# Patient Record
Sex: Female | Born: 1935 | Race: White | Hispanic: No
Health system: Southern US, Community
[De-identification: ages and names within clinical notes are randomized; demographics above are authoritative.]

---

## 2005-02-25 ENCOUNTER — Inpatient Hospital Stay: Payer: Self-pay | Admitting: Internal Medicine

## 2005-04-23 ENCOUNTER — Ambulatory Visit: Payer: Self-pay | Admitting: Internal Medicine

## 2005-06-04 ENCOUNTER — Ambulatory Visit: Payer: Self-pay | Admitting: Internal Medicine

## 2007-04-29 ENCOUNTER — Ambulatory Visit: Payer: Self-pay | Admitting: Specialist

## 2007-06-12 ENCOUNTER — Other Ambulatory Visit: Payer: Self-pay

## 2007-06-12 ENCOUNTER — Inpatient Hospital Stay: Payer: Self-pay | Admitting: Internal Medicine

## 2008-05-31 ENCOUNTER — Ambulatory Visit: Payer: Self-pay | Admitting: Internal Medicine

## 2008-06-09 ENCOUNTER — Ambulatory Visit: Payer: Self-pay | Admitting: Internal Medicine

## 2008-06-13 ENCOUNTER — Other Ambulatory Visit: Payer: Self-pay

## 2008-06-13 ENCOUNTER — Emergency Department: Payer: Self-pay | Admitting: Internal Medicine

## 2008-06-15 ENCOUNTER — Ambulatory Visit: Payer: Self-pay | Admitting: Internal Medicine

## 2008-08-23 ENCOUNTER — Ambulatory Visit: Payer: Self-pay | Admitting: Internal Medicine

## 2009-09-13 ENCOUNTER — Ambulatory Visit: Payer: Self-pay | Admitting: Internal Medicine

## 2009-09-25 ENCOUNTER — Ambulatory Visit: Payer: Self-pay | Admitting: Internal Medicine

## 2009-10-11 ENCOUNTER — Ambulatory Visit: Payer: Self-pay | Admitting: Surgery

## 2010-10-24 ENCOUNTER — Ambulatory Visit: Payer: Self-pay | Admitting: Internal Medicine

## 2011-12-05 ENCOUNTER — Inpatient Hospital Stay: Payer: Self-pay | Admitting: Internal Medicine

## 2013-03-17 ENCOUNTER — Ambulatory Visit: Payer: Self-pay | Admitting: Ophthalmology

## 2013-03-23 ENCOUNTER — Ambulatory Visit: Payer: Self-pay | Admitting: Ophthalmology

## 2013-04-28 ENCOUNTER — Ambulatory Visit: Payer: Self-pay | Admitting: Specialist

## 2013-05-16 ENCOUNTER — Ambulatory Visit: Payer: Self-pay | Admitting: Internal Medicine

## 2013-05-26 ENCOUNTER — Ambulatory Visit: Payer: Self-pay | Admitting: Specialist

## 2013-05-26 ENCOUNTER — Inpatient Hospital Stay: Payer: Self-pay | Admitting: Family Medicine

## 2013-05-26 LAB — CBC
HCT: 38.4 % (ref 35.0–47.0)
MCH: 30.4 pg (ref 26.0–34.0)
MCHC: 33.5 g/dL (ref 32.0–36.0)
Platelet: 314 10*3/uL (ref 150–440)
RBC: 4.22 10*6/uL (ref 3.80–5.20)
WBC: 9.8 10*3/uL (ref 3.6–11.0)

## 2013-05-26 LAB — COMPREHENSIVE METABOLIC PANEL
Albumin: 3.4 g/dL (ref 3.4–5.0)
BUN: 11 mg/dL (ref 7–18)
Chloride: 89 mmol/L — ABNORMAL LOW (ref 98–107)
Co2: 42 mmol/L (ref 21–32)
Creatinine: 0.62 mg/dL (ref 0.60–1.30)
EGFR (Non-African Amer.): >60
Osmolality: 263 (ref 275–301)
SGOT(AST): 31 U/L (ref 15–37)
SGPT (ALT): 30 U/L (ref 12–78)
Sodium: 132 mmol/L — ABNORMAL LOW (ref 136–145)
Total Protein: 6.2 g/dL — ABNORMAL LOW (ref 6.4–8.2)

## 2013-05-26 LAB — CK TOTAL AND CKMB (NOT AT ARMC): CK, Total: 43 U/L (ref 21–215)

## 2013-05-26 LAB — PRO B NATRIURETIC PEPTIDE: B-Type Natriuretic Peptide: 4979 pg/mL — ABNORMAL HIGH (ref 0–450)

## 2013-05-26 LAB — APTT: Activated PTT: 31.9 secs (ref 23.6–35.9)

## 2013-05-26 LAB — PROTIME-INR: INR: 1.2

## 2013-05-26 LAB — PLATELET COUNT: Platelet: 323 10*3/uL (ref 150–440)

## 2013-05-26 LAB — TROPONIN I: Troponin-I: <0.02 ng/mL

## 2013-05-27 LAB — URINALYSIS, COMPLETE
Bacteria: NONE SEEN
Bilirubin,UR: NEGATIVE
Blood: NEGATIVE
Glucose,UR: NEGATIVE mg/dL (ref 0–75)
Ph: 6 (ref 4.5–8.0)
Protein: NEGATIVE
RBC,UR: NONE SEEN /HPF (ref 0–5)
Squamous Epithelial: 1

## 2013-05-27 LAB — CBC WITH DIFFERENTIAL/PLATELET
Basophil #: 0 10*3/uL (ref 0.0–0.1)
Eosinophil %: 1.2 %
HCT: 35.3 % (ref 35.0–47.0)
Lymphocyte %: 15.5 %
MCH: 30.6 pg (ref 26.0–34.0)
MCHC: 34.1 g/dL (ref 32.0–36.0)
MCV: 90 fL (ref 80–100)
Neutrophil #: 6.2 10*3/uL (ref 1.4–6.5)
RBC: 3.92 10*6/uL (ref 3.80–5.20)
RDW: 15 % — ABNORMAL HIGH (ref 11.5–14.5)
WBC: 8.8 10*3/uL (ref 3.6–11.0)

## 2013-05-27 LAB — BODY FLUID CELL COUNT WITH DIFFERENTIAL
Eosinophil: 0 %
Lymphocytes: 57 %
Nucleated Cell Count: 381 /mm3
Other Cells BF: 0 %

## 2013-05-27 LAB — PROTEIN, BODY FLUID: Protein, Body Fluid: 1.3 g/dL

## 2013-05-27 LAB — BASIC METABOLIC PANEL
Calcium, Total: 8.7 mg/dL (ref 8.5–10.1)
Co2: 42 mmol/L (ref 21–32)
EGFR (Non-African Amer.): >60
Glucose: 93 mg/dL (ref 65–99)
Osmolality: 268 (ref 275–301)

## 2013-05-27 LAB — GLUCOSE, SEROUS FLUID: Glucose, Body Fluid: 106 mg/dL

## 2013-05-28 LAB — BASIC METABOLIC PANEL
BUN: 13 mg/dL (ref 7–18)
Chloride: 89 mmol/L — ABNORMAL LOW (ref 98–107)
Co2: >45 mmol/L (ref 21–32)
Creatinine: 0.77 mg/dL (ref 0.60–1.30)
EGFR (African American): >60
EGFR (Non-African Amer.): >60
Potassium: 3.5 mmol/L (ref 3.5–5.1)

## 2013-05-29 LAB — CBC WITH DIFFERENTIAL/PLATELET
HCT: 39.1 % (ref 35.0–47.0)
HGB: 12.8 g/dL (ref 12.0–16.0)
MCHC: 32.6 g/dL (ref 32.0–36.0)
Neutrophil #: 7 10*3/uL — ABNORMAL HIGH (ref 1.4–6.5)
RDW: 15.3 % — ABNORMAL HIGH (ref 11.5–14.5)

## 2013-05-29 LAB — BASIC METABOLIC PANEL
Anion Gap: 4 — ABNORMAL LOW (ref 7–16)
BUN: 14 mg/dL (ref 7–18)
Creatinine: 0.67 mg/dL (ref 0.60–1.30)
EGFR (African American): >60
Glucose: 148 mg/dL — ABNORMAL HIGH (ref 65–99)
Osmolality: 273 (ref 275–301)

## 2013-05-31 LAB — CBC WITH DIFFERENTIAL/PLATELET
Basophil %: 0.3 %
Eosinophil #: 0.1 10*3/uL (ref 0.0–0.7)
Eosinophil %: 1.2 %
HGB: 12.2 g/dL (ref 12.0–16.0)
Lymphocyte %: 14.3 %
MCH: 29.9 pg (ref 26.0–34.0)
MCHC: 32.9 g/dL (ref 32.0–36.0)
Monocyte #: 0.9 x10 3/mm (ref 0.2–0.9)
Monocyte %: 9.3 %
Platelet: 266 10*3/uL (ref 150–440)

## 2013-05-31 LAB — BASIC METABOLIC PANEL
BUN: 15 mg/dL (ref 7–18)
Chloride: 85 mmol/L — ABNORMAL LOW (ref 98–107)
Creatinine: 0.67 mg/dL (ref 0.60–1.30)
EGFR (African American): >60
EGFR (Non-African Amer.): >60
Osmolality: 270 (ref 275–301)
Potassium: 3.9 mmol/L (ref 3.5–5.1)
Sodium: 135 mmol/L — ABNORMAL LOW (ref 136–145)

## 2013-05-31 LAB — BODY FLUID CULTURE

## 2013-06-01 LAB — BASIC METABOLIC PANEL
Anion Gap: 4 — ABNORMAL LOW (ref 7–16)
Calcium, Total: 8.5 mg/dL (ref 8.5–10.1)
Co2: 44 mmol/L (ref 21–32)
Creatinine: 0.57 mg/dL — ABNORMAL LOW (ref 0.60–1.30)
EGFR (African American): >60
EGFR (Non-African Amer.): >60
Glucose: 86 mg/dL (ref 65–99)
Potassium: 3.8 mmol/L (ref 3.5–5.1)

## 2013-06-01 LAB — CBC WITH DIFFERENTIAL/PLATELET
Basophil #: 0 10*3/uL (ref 0.0–0.1)
HGB: 13 g/dL (ref 12.0–16.0)
Lymphocyte #: 1.4 10*3/uL (ref 1.0–3.6)
Lymphocyte %: 13.4 %
MCHC: 32.8 g/dL (ref 32.0–36.0)
MCV: 91 fL (ref 80–100)
Monocyte %: 9.8 %
Neutrophil #: 8 10*3/uL — ABNORMAL HIGH (ref 1.4–6.5)
Platelet: 283 10*3/uL (ref 150–440)
RBC: 4.36 10*6/uL (ref 3.80–5.20)
WBC: 10.6 10*3/uL (ref 3.6–11.0)

## 2013-06-02 LAB — BASIC METABOLIC PANEL
Anion Gap: 3 — ABNORMAL LOW (ref 7–16)
BUN: 10 mg/dL (ref 7–18)
Chloride: 90 mmol/L — ABNORMAL LOW (ref 98–107)
Co2: 44 mmol/L (ref 21–32)
Creatinine: 0.55 mg/dL — ABNORMAL LOW (ref 0.60–1.30)
EGFR (African American): >60
EGFR (Non-African Amer.): >60
Glucose: 90 mg/dL (ref 65–99)
Osmolality: 272 (ref 275–301)
Potassium: 3.4 mmol/L — ABNORMAL LOW (ref 3.5–5.1)
Sodium: 137 mmol/L (ref 136–145)

## 2013-06-03 LAB — BASIC METABOLIC PANEL
BUN: 12 mg/dL (ref 7–18)
Calcium, Total: 8.9 mg/dL (ref 8.5–10.1)
Co2: 44 mmol/L (ref 21–32)
Creatinine: 0.54 mg/dL — ABNORMAL LOW (ref 0.60–1.30)
EGFR (Non-African Amer.): >60
Glucose: 91 mg/dL (ref 65–99)
Potassium: 3.8 mmol/L (ref 3.5–5.1)
Sodium: 136 mmol/L (ref 136–145)

## 2013-06-04 LAB — BASIC METABOLIC PANEL
Anion Gap: 0 — ABNORMAL LOW (ref 7–16)
BUN: 13 mg/dL (ref 7–18)
Co2: 44 mmol/L (ref 21–32)
Creatinine: 0.67 mg/dL (ref 0.60–1.30)
Glucose: 98 mg/dL (ref 65–99)
Osmolality: 276 (ref 275–301)
Potassium: 4.5 mmol/L (ref 3.5–5.1)
Sodium: 138 mmol/L (ref 136–145)

## 2013-06-04 LAB — CBC WITH DIFFERENTIAL/PLATELET
Lymphocyte %: 11.7 %
Neutrophil #: 8.6 10*3/uL — ABNORMAL HIGH (ref 1.4–6.5)
Neutrophil %: 77.5 %
Platelet: 283 10*3/uL (ref 150–440)
RBC: 4.32 10*6/uL (ref 3.80–5.20)
WBC: 11.1 10*3/uL — ABNORMAL HIGH (ref 3.6–11.0)

## 2013-06-06 LAB — CULTURE, FUNGUS WITHOUT SMEAR

## 2013-06-17 LAB — CULTURE, FUNGUS WITHOUT SMEAR

## 2013-09-11 ENCOUNTER — Inpatient Hospital Stay: Payer: Self-pay | Admitting: Family Medicine

## 2013-09-11 LAB — BASIC METABOLIC PANEL
BUN: 22 mg/dL — ABNORMAL HIGH (ref 7–18)
Calcium, Total: 9.9 mg/dL (ref 8.5–10.1)
Chloride: 97 mmol/L — ABNORMAL LOW (ref 98–107)
Co2: 43 mmol/L (ref 21–32)
Creatinine: 0.68 mg/dL (ref 0.60–1.30)
EGFR (Non-African Amer.): >60
Glucose: 143 mg/dL — ABNORMAL HIGH (ref 65–99)
Sodium: 139 mmol/L (ref 136–145)

## 2013-09-11 LAB — TROPONIN I
Troponin-I: 0.02 ng/mL
Troponin-I: 0.02 ng/mL

## 2013-09-11 LAB — CK TOTAL AND CKMB (NOT AT ARMC)
CK, Total: 20 U/L — ABNORMAL LOW (ref 21–215)
CK-MB: 1.6 ng/mL (ref 0.5–3.6)

## 2013-09-11 LAB — CBC
MCHC: 32.9 g/dL (ref 32.0–36.0)
MCV: 92 fL (ref 80–100)
RBC: 3.65 10*6/uL — ABNORMAL LOW (ref 3.80–5.20)
RDW: 15.9 % — ABNORMAL HIGH (ref 11.5–14.5)

## 2013-09-11 LAB — DIGOXIN LEVEL: Digoxin: 1.23 ng/mL

## 2013-09-12 LAB — CBC WITH DIFFERENTIAL/PLATELET
Basophil %: 0.1 %
Eosinophil %: 0 %
HCT: 32 % — ABNORMAL LOW (ref 35.0–47.0)
Lymphocyte %: 9.4 %
MCH: 30 pg (ref 26.0–34.0)
Monocyte #: 0.1 x10 3/mm — ABNORMAL LOW (ref 0.2–0.9)
Neutrophil #: 4 10*3/uL (ref 1.4–6.5)
Platelet: 193 10*3/uL (ref 150–440)
RDW: 16 % — ABNORMAL HIGH (ref 11.5–14.5)

## 2013-09-12 LAB — LIPID PANEL
HDL Cholesterol: 32 mg/dL — ABNORMAL LOW (ref 40–60)
Ldl Cholesterol, Calc: 137 mg/dL — ABNORMAL HIGH (ref 0–100)
Triglycerides: 54 mg/dL (ref 0–200)

## 2013-09-12 LAB — BASIC METABOLIC PANEL
Anion Gap: 1 — ABNORMAL LOW (ref 7–16)
BUN: 22 mg/dL — ABNORMAL HIGH (ref 7–18)
Calcium, Total: 9.5 mg/dL (ref 8.5–10.1)
Chloride: 96 mmol/L — ABNORMAL LOW (ref 98–107)
Creatinine: 0.65 mg/dL (ref 0.60–1.30)
EGFR (African American): >60
EGFR (Non-African Amer.): >60
Osmolality: 281 (ref 275–301)
Sodium: 138 mmol/L (ref 136–145)

## 2013-09-12 LAB — TROPONIN I: Troponin-I: <0.02 ng/mL

## 2013-09-14 LAB — CBC WITH DIFFERENTIAL/PLATELET
Basophil %: 0.1 %
Eosinophil #: 0 10*3/uL (ref 0.0–0.7)
Eosinophil %: 0 %
HCT: 34.5 % — ABNORMAL LOW (ref 35.0–47.0)
HGB: 11.2 g/dL — ABNORMAL LOW (ref 12.0–16.0)
MCH: 29.6 pg (ref 26.0–34.0)
MCHC: 32.5 g/dL (ref 32.0–36.0)
Monocyte #: 1 x10 3/mm — ABNORMAL HIGH (ref 0.2–0.9)
Monocyte %: 9.7 %
Neutrophil #: 8.3 10*3/uL — ABNORMAL HIGH (ref 1.4–6.5)
Neutrophil %: 83.3 %
Platelet: 247 10*3/uL (ref 150–440)
RDW: 15.6 % — ABNORMAL HIGH (ref 11.5–14.5)
WBC: 10 10*3/uL (ref 3.6–11.0)

## 2013-09-14 LAB — BASIC METABOLIC PANEL
Creatinine: 0.67 mg/dL (ref 0.60–1.30)
Osmolality: 280 (ref 275–301)
Sodium: 138 mmol/L (ref 136–145)

## 2013-09-15 LAB — CBC WITH DIFFERENTIAL/PLATELET
Basophil #: 0 10*3/uL (ref 0.0–0.1)
Basophil %: 0.1 %
Eosinophil %: 0 %
HCT: 32.2 % — ABNORMAL LOW (ref 35.0–47.0)
HGB: 10.5 g/dL — ABNORMAL LOW (ref 12.0–16.0)
Lymphocyte %: 12.3 %
MCH: 29.9 pg (ref 26.0–34.0)
MCHC: 32.7 g/dL (ref 32.0–36.0)
MCV: 92 fL (ref 80–100)
Monocyte #: 1.2 x10 3/mm — ABNORMAL HIGH (ref 0.2–0.9)
Monocyte %: 13.4 %
Neutrophil #: 6.5 10*3/uL (ref 1.4–6.5)
RBC: 3.52 10*6/uL — ABNORMAL LOW (ref 3.80–5.20)
WBC: 8.7 10*3/uL (ref 3.6–11.0)

## 2013-09-15 LAB — BASIC METABOLIC PANEL
BUN: 20 mg/dL — ABNORMAL HIGH (ref 7–18)
Co2: >45 mmol/L (ref 21–32)
Creatinine: 0.63 mg/dL (ref 0.60–1.30)
EGFR (African American): >60
EGFR (Non-African Amer.): >60
Potassium: 3.7 mmol/L (ref 3.5–5.1)

## 2013-09-16 LAB — CBC WITH DIFFERENTIAL/PLATELET
Basophil #: 0 10*3/uL (ref 0.0–0.1)
Basophil %: 0.1 %
Eosinophil #: 0 10*3/uL (ref 0.0–0.7)
Lymphocyte %: 11.4 %
MCH: 30.1 pg (ref 26.0–34.0)
MCHC: 32.6 g/dL (ref 32.0–36.0)
MCV: 92 fL (ref 80–100)
Monocyte %: 13.6 %
Neutrophil #: 6.3 10*3/uL (ref 1.4–6.5)
Neutrophil %: 74.8 %
Platelet: 231 10*3/uL (ref 150–440)
RDW: 15.7 % — ABNORMAL HIGH (ref 11.5–14.5)
WBC: 8.4 10*3/uL (ref 3.6–11.0)

## 2013-09-16 LAB — BASIC METABOLIC PANEL
Anion Gap: 8 (ref 7–16)
BUN: 22 mg/dL — ABNORMAL HIGH (ref 7–18)
Calcium, Total: 9.1 mg/dL (ref 8.5–10.1)
Chloride: 91 mmol/L — ABNORMAL LOW (ref 98–107)
Creatinine: 0.64 mg/dL (ref 0.60–1.30)
EGFR (Non-African Amer.): >60
Osmolality: 277 (ref 275–301)

## 2013-10-17 ENCOUNTER — Inpatient Hospital Stay: Payer: Self-pay | Admitting: Family Medicine

## 2013-10-17 LAB — CBC WITH DIFFERENTIAL/PLATELET
Eosinophil %: 0 %
HGB: 11.8 g/dL — ABNORMAL LOW (ref 12.0–16.0)
Lymphocyte #: 0.6 10*3/uL — ABNORMAL LOW (ref 1.0–3.6)
MCH: 29.3 pg (ref 26.0–34.0)
MCHC: 31.9 g/dL — ABNORMAL LOW (ref 32.0–36.0)
Monocyte #: 2 x10 3/mm — ABNORMAL HIGH (ref 0.2–0.9)
Monocyte %: 14.6 %
Neutrophil %: 80.8 %
RDW: 16.3 % — ABNORMAL HIGH (ref 11.5–14.5)

## 2013-10-17 LAB — COMPREHENSIVE METABOLIC PANEL
Albumin: 3.6 g/dL (ref 3.4–5.0)
BUN: 16 mg/dL (ref 7–18)
Bilirubin,Total: 0.6 mg/dL (ref 0.2–1.0)
Calcium, Total: 9 mg/dL (ref 8.5–10.1)
Chloride: 98 mmol/L (ref 98–107)
Co2: 44 mmol/L (ref 21–32)
Creatinine: 0.63 mg/dL (ref 0.60–1.30)
Glucose: 154 mg/dL — ABNORMAL HIGH (ref 65–99)
Osmolality: 284 (ref 275–301)
SGOT(AST): 62 U/L — ABNORMAL HIGH (ref 15–37)
SGPT (ALT): 47 U/L (ref 12–78)
Sodium: 140 mmol/L (ref 136–145)
Total Protein: 7 g/dL (ref 6.4–8.2)

## 2013-10-17 LAB — MAGNESIUM: Magnesium: 2.3 mg/dL

## 2013-10-17 LAB — PHOSPHORUS: Phosphorus: 5.1 mg/dL — ABNORMAL HIGH (ref 2.5–4.9)

## 2013-10-17 LAB — URINALYSIS, COMPLETE
Bilirubin,UR: NEGATIVE
Blood: NEGATIVE
Glucose,UR: 50 mg/dL (ref 0–75)
Nitrite: NEGATIVE
Ph: 5 (ref 4.5–8.0)
Protein: 100
Specific Gravity: 1.019 (ref 1.003–1.030)
Squamous Epithelial: 4
WBC UR: 3 /HPF (ref 0–5)

## 2013-10-17 LAB — PROTIME-INR: Prothrombin Time: 18.2 secs — ABNORMAL HIGH (ref 11.5–14.7)

## 2013-10-18 LAB — CBC WITH DIFFERENTIAL/PLATELET
Basophil #: 0 10*3/uL (ref 0.0–0.1)
Eosinophil %: 0 %
HCT: 31.7 % — ABNORMAL LOW (ref 35.0–47.0)
HGB: 10.5 g/dL — ABNORMAL LOW (ref 12.0–16.0)
MCH: 29.8 pg (ref 26.0–34.0)
Monocyte #: 0.2 x10 3/mm (ref 0.2–0.9)
Monocyte %: 3.6 %
Neutrophil #: 5.9 10*3/uL (ref 1.4–6.5)
Neutrophil %: 89 %
RBC: 3.53 10*6/uL — ABNORMAL LOW (ref 3.80–5.20)
WBC: 6.7 10*3/uL (ref 3.6–11.0)

## 2013-10-18 LAB — BASIC METABOLIC PANEL
Calcium, Total: 9.3 mg/dL (ref 8.5–10.1)
Chloride: 97 mmol/L — ABNORMAL LOW (ref 98–107)
Co2: 40 mmol/L (ref 21–32)
Creatinine: 0.85 mg/dL (ref 0.60–1.30)
EGFR (Non-African Amer.): >60
Glucose: 133 mg/dL — ABNORMAL HIGH (ref 65–99)
Osmolality: 279 (ref 275–301)
Potassium: 4.6 mmol/L (ref 3.5–5.1)

## 2013-10-18 LAB — URINE CULTURE

## 2013-10-19 LAB — BASIC METABOLIC PANEL
BUN: 23 mg/dL — ABNORMAL HIGH (ref 7–18)
Calcium, Total: 9.1 mg/dL (ref 8.5–10.1)
Co2: 39 mmol/L — ABNORMAL HIGH (ref 21–32)
EGFR (African American): >60
EGFR (Non-African Amer.): >60
Osmolality: 278 (ref 275–301)
Potassium: 4.4 mmol/L (ref 3.5–5.1)
Sodium: 136 mmol/L (ref 136–145)

## 2013-10-19 LAB — CBC WITH DIFFERENTIAL/PLATELET
Basophil %: 0.2 %
Eosinophil #: 0 10*3/uL (ref 0.0–0.7)
Eosinophil %: 0 %
HCT: 32.5 % — ABNORMAL LOW (ref 35.0–47.0)
HGB: 10.6 g/dL — ABNORMAL LOW (ref 12.0–16.0)
Lymphocyte #: 0.5 10*3/uL — ABNORMAL LOW (ref 1.0–3.6)
MCHC: 32.7 g/dL (ref 32.0–36.0)
Monocyte #: 0.4 x10 3/mm (ref 0.2–0.9)
Monocyte %: 3.7 %
Neutrophil #: 10.5 10*3/uL — ABNORMAL HIGH (ref 1.4–6.5)
Platelet: 339 10*3/uL (ref 150–440)
RBC: 3.64 10*6/uL — ABNORMAL LOW (ref 3.80–5.20)
RDW: 16 % — ABNORMAL HIGH (ref 11.5–14.5)

## 2013-10-19 LAB — LACTATE DEHYDROGENASE, PLEURAL OR PERITONEAL FLUID: LDH, Body Fluid: 50 U/L

## 2013-10-19 LAB — GLUCOSE, SEROUS FLUID: Glucose, Body Fluid: 156 mg/dL

## 2013-10-19 LAB — PHOSPHORUS: Phosphorus: 4.3 mg/dL (ref 2.5–4.9)

## 2013-10-19 LAB — MAGNESIUM: Magnesium: 2 mg/dL

## 2013-10-20 LAB — CBC WITH DIFFERENTIAL/PLATELET
Basophil %: 0.1 %
Eosinophil %: 0 %
HCT: 30.6 % — ABNORMAL LOW (ref 35.0–47.0)
HGB: 10.2 g/dL — ABNORMAL LOW (ref 12.0–16.0)
Lymphocyte #: 0.3 10*3/uL — ABNORMAL LOW (ref 1.0–3.6)
Lymphocyte %: 2.5 %
MCH: 29.8 pg (ref 26.0–34.0)
MCHC: 33.4 g/dL (ref 32.0–36.0)
MCV: 89 fL (ref 80–100)
Monocyte %: 6.5 %
Neutrophil %: 90.9 %
RDW: 16.3 % — ABNORMAL HIGH (ref 11.5–14.5)
WBC: 10.9 10*3/uL (ref 3.6–11.0)

## 2013-10-20 LAB — BASIC METABOLIC PANEL
BUN: 23 mg/dL — ABNORMAL HIGH (ref 7–18)
Chloride: 95 mmol/L — ABNORMAL LOW (ref 98–107)
Creatinine: 0.86 mg/dL (ref 0.60–1.30)
EGFR (Non-African Amer.): >60
Glucose: 173 mg/dL — ABNORMAL HIGH (ref 65–99)
Potassium: 3.9 mmol/L (ref 3.5–5.1)

## 2013-10-20 LAB — VANCOMYCIN, TROUGH: Vancomycin, Trough: 9 ug/mL — ABNORMAL LOW (ref 10–20)

## 2013-10-21 LAB — CBC WITH DIFFERENTIAL/PLATELET
Basophil #: 0 10*3/uL (ref 0.0–0.1)
Basophil %: 0.2 %
Eosinophil #: 0 10*3/uL (ref 0.0–0.7)
Eosinophil %: 0 %
HGB: 10.5 g/dL — ABNORMAL LOW (ref 12.0–16.0)
Lymphocyte #: 0.4 10*3/uL — ABNORMAL LOW (ref 1.0–3.6)
MCHC: 32.1 g/dL (ref 32.0–36.0)
MCV: 89 fL (ref 80–100)
Monocyte #: 1 x10 3/mm — ABNORMAL HIGH (ref 0.2–0.9)
Monocyte %: 8.7 %
Neutrophil #: 9.7 10*3/uL — ABNORMAL HIGH (ref 1.4–6.5)
RBC: 3.65 10*6/uL — ABNORMAL LOW (ref 3.80–5.20)

## 2013-10-21 LAB — COMPREHENSIVE METABOLIC PANEL
Albumin: 3.1 g/dL — ABNORMAL LOW (ref 3.4–5.0)
Anion Gap: 0 — ABNORMAL LOW (ref 7–16)
BUN: 20 mg/dL — ABNORMAL HIGH (ref 7–18)
Bilirubin,Total: 0.6 mg/dL (ref 0.2–1.0)
Calcium, Total: 9.3 mg/dL (ref 8.5–10.1)
Chloride: 95 mmol/L — ABNORMAL LOW (ref 98–107)
Co2: 44 mmol/L (ref 21–32)
Creatinine: 0.72 mg/dL (ref 0.60–1.30)
Glucose: 140 mg/dL — ABNORMAL HIGH (ref 65–99)
Potassium: 4.2 mmol/L (ref 3.5–5.1)
SGPT (ALT): 36 U/L (ref 12–78)
Sodium: 139 mmol/L (ref 136–145)
Total Protein: 6 g/dL — ABNORMAL LOW (ref 6.4–8.2)

## 2013-10-22 LAB — CBC WITH DIFFERENTIAL/PLATELET
Eosinophil %: 0.1 %
HGB: 10.7 g/dL — ABNORMAL LOW (ref 12.0–16.0)
Lymphocyte #: 0.7 10*3/uL — ABNORMAL LOW (ref 1.0–3.6)
MCH: 28.8 pg (ref 26.0–34.0)
Monocyte #: 1.1 x10 3/mm — ABNORMAL HIGH (ref 0.2–0.9)
Monocyte %: 10 %
Neutrophil %: 83.6 %
RBC: 3.73 10*6/uL — ABNORMAL LOW (ref 3.80–5.20)
WBC: 11 10*3/uL (ref 3.6–11.0)

## 2013-10-22 LAB — BASIC METABOLIC PANEL
BUN: 18 mg/dL (ref 7–18)
Calcium, Total: 9.1 mg/dL (ref 8.5–10.1)
EGFR (Non-African Amer.): >60
Glucose: 121 mg/dL — ABNORMAL HIGH (ref 65–99)
Osmolality: 283 (ref 275–301)
Potassium: 4.1 mmol/L (ref 3.5–5.1)
Sodium: 140 mmol/L (ref 136–145)

## 2013-10-22 LAB — CULTURE, BLOOD (SINGLE)

## 2013-10-23 LAB — BODY FLUID CULTURE

## 2013-10-24 ENCOUNTER — Ambulatory Visit: Payer: Self-pay | Admitting: Internal Medicine

## 2013-10-24 LAB — DIGOXIN LEVEL: Digoxin: 1.15 ng/mL

## 2013-10-25 LAB — CBC WITH DIFFERENTIAL/PLATELET
Basophil #: 0 10*3/uL (ref 0.0–0.1)
Basophil %: 0.1 %
Eosinophil #: 0 10*3/uL (ref 0.0–0.7)
HCT: 29.7 % — ABNORMAL LOW (ref 35.0–47.0)
HGB: 9.8 g/dL — ABNORMAL LOW (ref 12.0–16.0)
Lymphocyte #: 0.3 10*3/uL — ABNORMAL LOW (ref 1.0–3.6)
Lymphocyte %: 1.9 %
MCH: 29.2 pg (ref 26.0–34.0)
Monocyte #: 0.4 x10 3/mm (ref 0.2–0.9)
Monocyte %: 2.8 %
Neutrophil #: 13.5 10*3/uL — ABNORMAL HIGH (ref 1.4–6.5)
Platelet: 249 10*3/uL (ref 150–440)
RDW: 16.1 % — ABNORMAL HIGH (ref 11.5–14.5)

## 2013-10-25 LAB — BASIC METABOLIC PANEL
EGFR (African American): >60
EGFR (Non-African Amer.): >60
Glucose: 204 mg/dL — ABNORMAL HIGH (ref 65–99)
Osmolality: 292 (ref 275–301)
Sodium: 139 mmol/L (ref 136–145)

## 2013-10-28 LAB — CULTURE, BLOOD (SINGLE)

## 2013-11-14 ENCOUNTER — Ambulatory Visit: Payer: Self-pay | Admitting: Internal Medicine

## 2013-11-14 DEATH — deceased

## 2014-05-03 IMAGING — CR DG CHEST 1V PORT
1 series · 1 of 1 positions shown · non-contrast
Comparison: none

REASON FOR EXAM: pneumothorax-s/p thoracentesis
COMMENTS:

PROCEDURE:     DXR - DXR PORTABLE CHEST SINGLE VIEW  - May 27, 2013 [DATE]
RESULT:     The repeat chest x-ray one hour post thoracentesis reveals no
evidence of pneumothorax. Cardia megaly pulmonary venous congestion with
interstitial prominence present. Small right pleural effusion.

[ap]
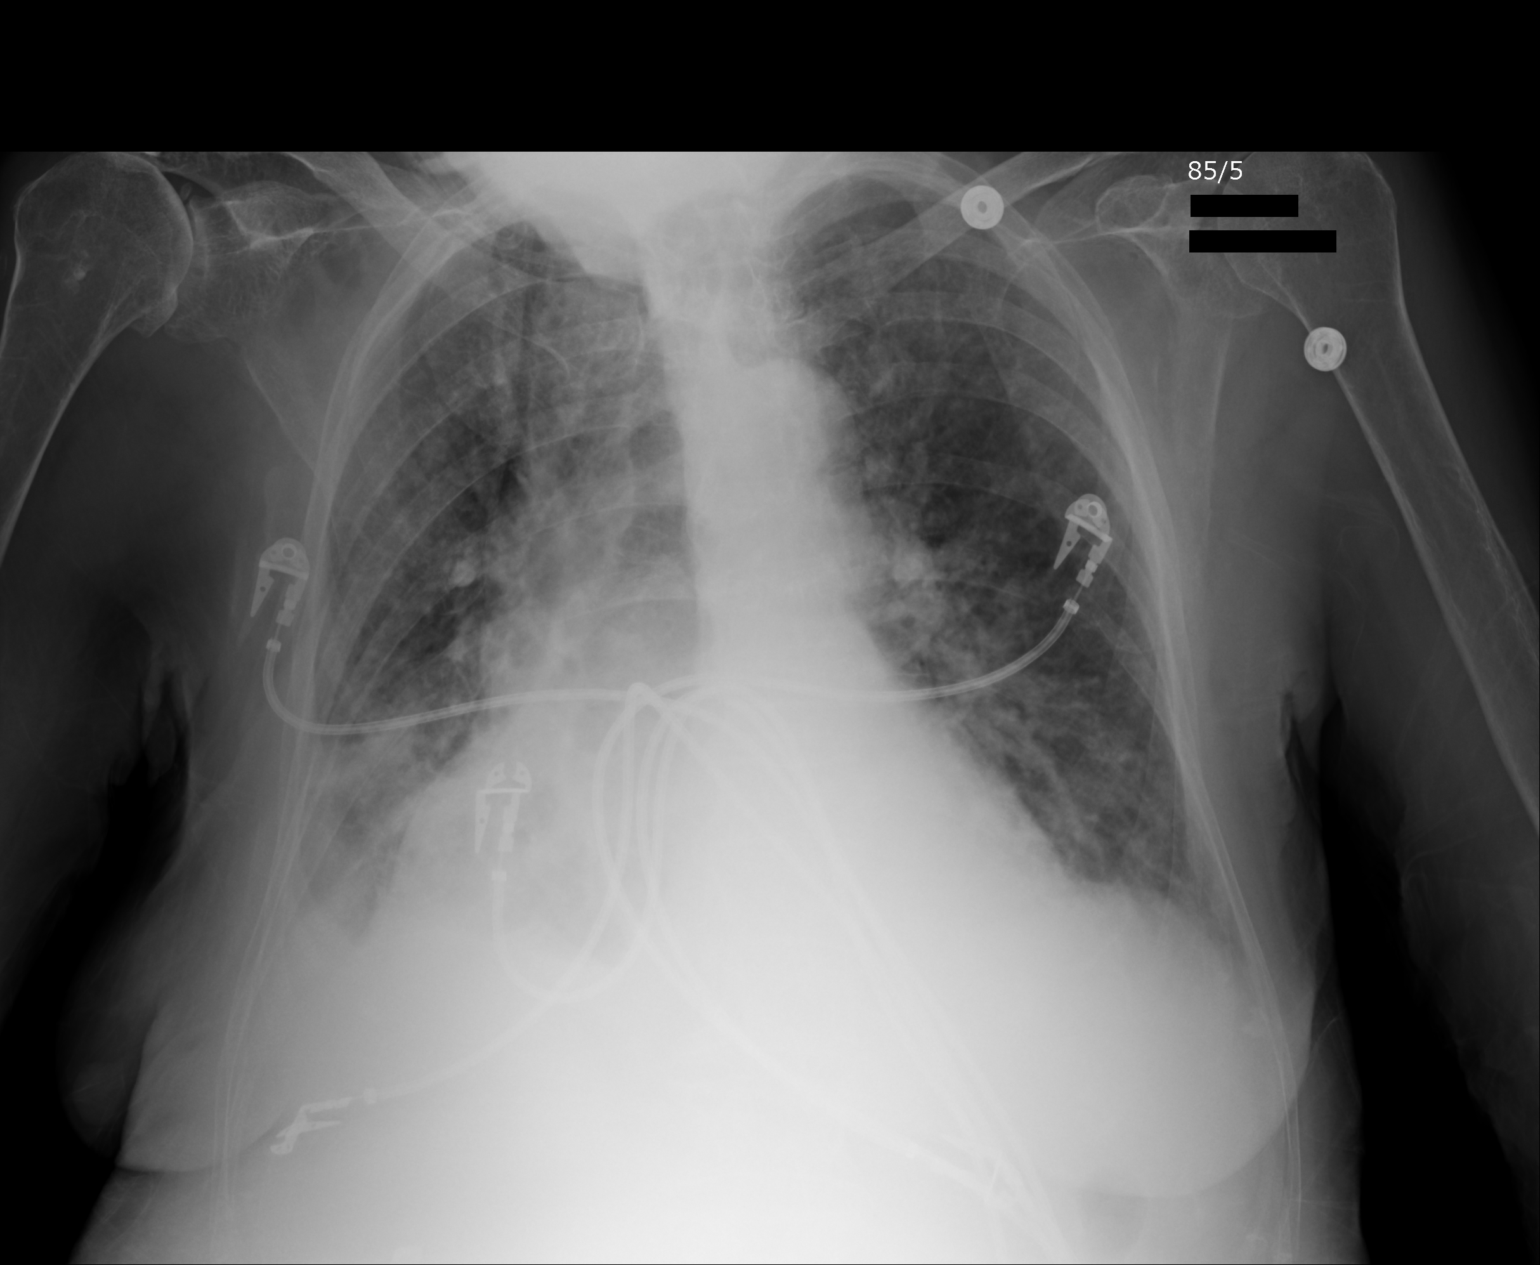

[1 of 1 positions shown; findings below may reference images not displayed]

IMPRESSION: Findings consistent with congestive heart failure with
interstitial edema, this is progressed from prior chest x-ray. Thoracentesis
was successful. No pneumothorax.

## 2014-05-03 IMAGING — CR DG CHEST POST BIOPSY - THORACENTESIS
1 series · 1 of 1 positions shown · non-contrast
Comparison: none

REASON FOR EXAM: post thoracentesis
COMMENTS:

[x chest ap]
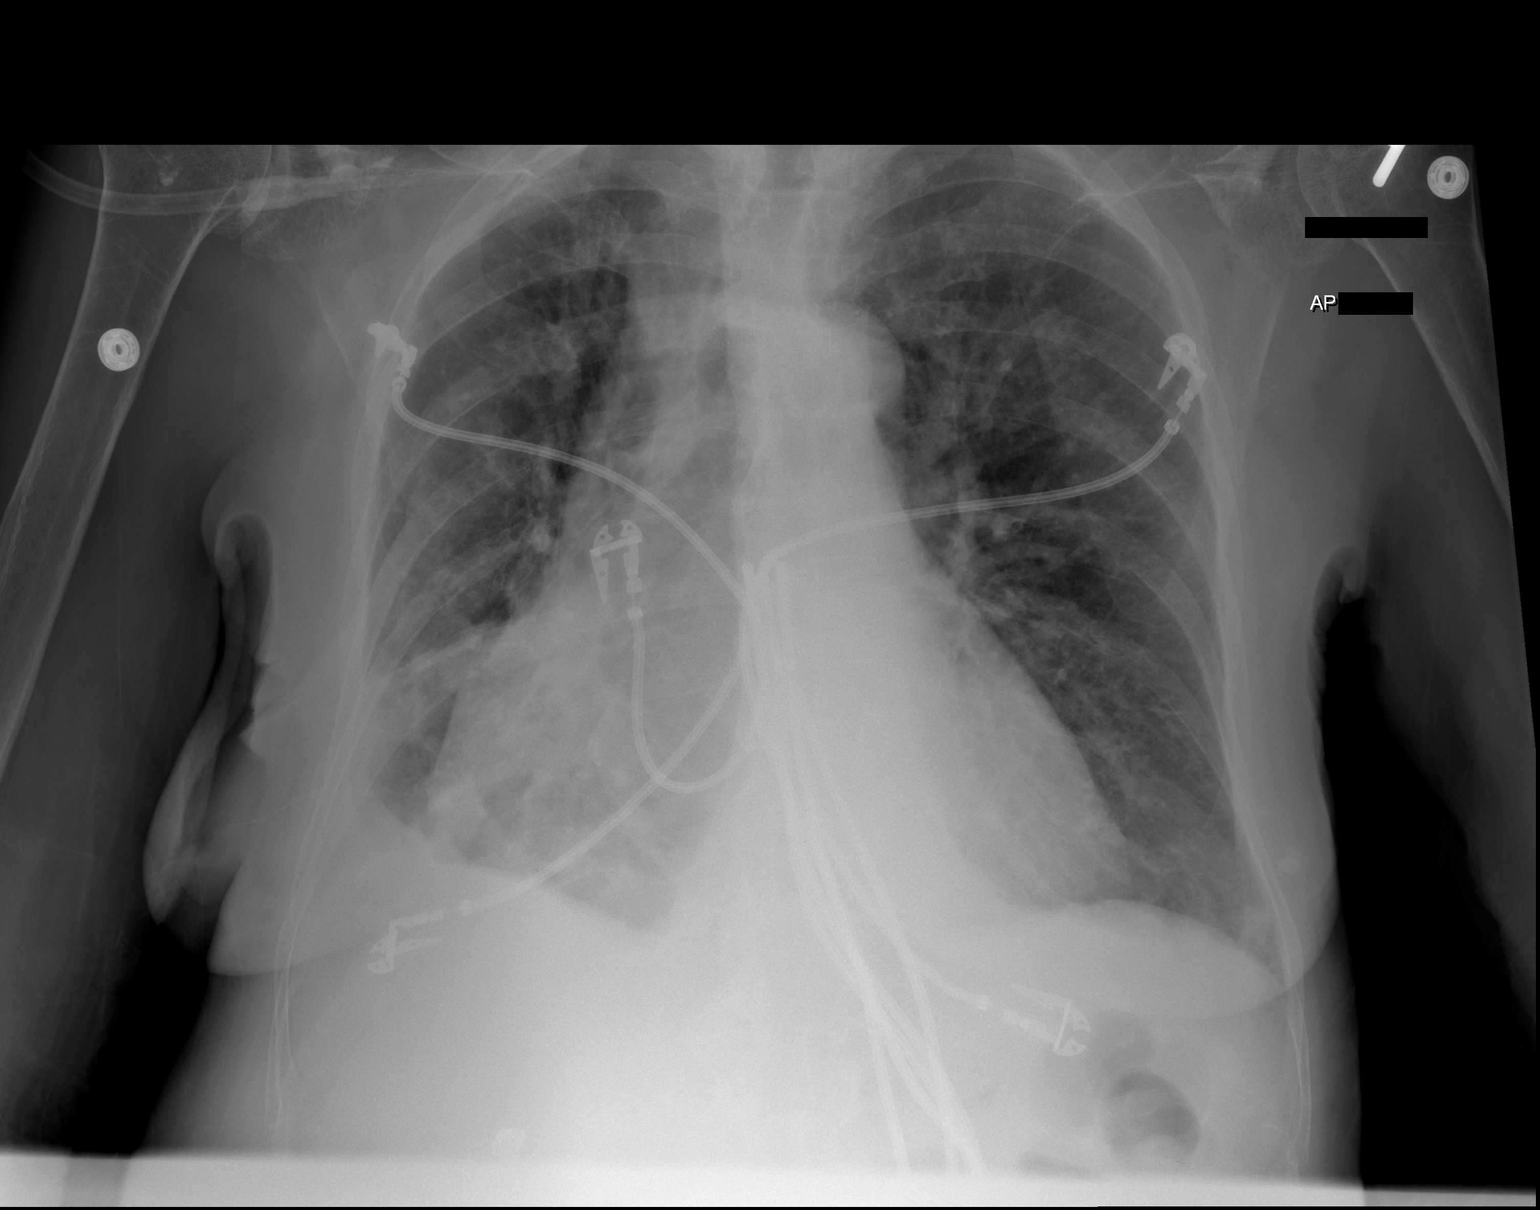

[1 of 1 positions shown; findings below may reference images not displayed]

PROCEDURE:     DXR - DXR CHEST 1 VIEW FEDERICO BAM  - May 27, 2013 [DATE]

RESULT:     Patient status post thoracentesis. Linear density noted along
the right pleural surface is most likely pleural scarring lung markings
appear to be present, repeat chest x-ray in one hour to be performed to rule
out pneumothorax.
IMPRESSION: Successful thoracentesis .

## 2014-05-03 IMAGING — US US GUIDE NEEDLE - US [PERSON_NAME]
1 series · 3 of 3 positions shown · non-contrast
Comparison: none

REASON FOR EXAM: right pleural effusion
COMMENTS:

PROCEDURE:     US  - US GUIDED THORACENTESIS RIGHT  - May 27, 2013 [DATE]
RESULT:     Ultrasound directed thoracentesis performed without complication
following sterile preparation with local SC 1% lidocaine. 7288 cc of clear
yellow fluid removed. No complications.

[Series 1: us guide needle - us (person_name) · 0.27mm/px · 3 of 3 slices shown]
[im 1/3]
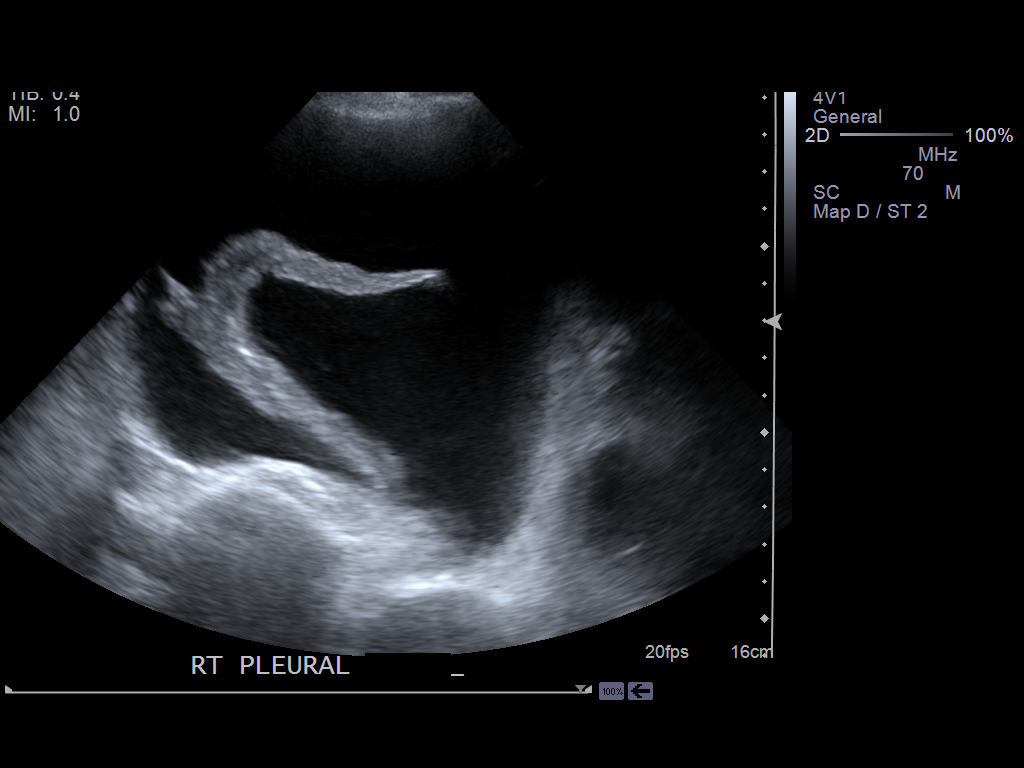
[im 2/3]
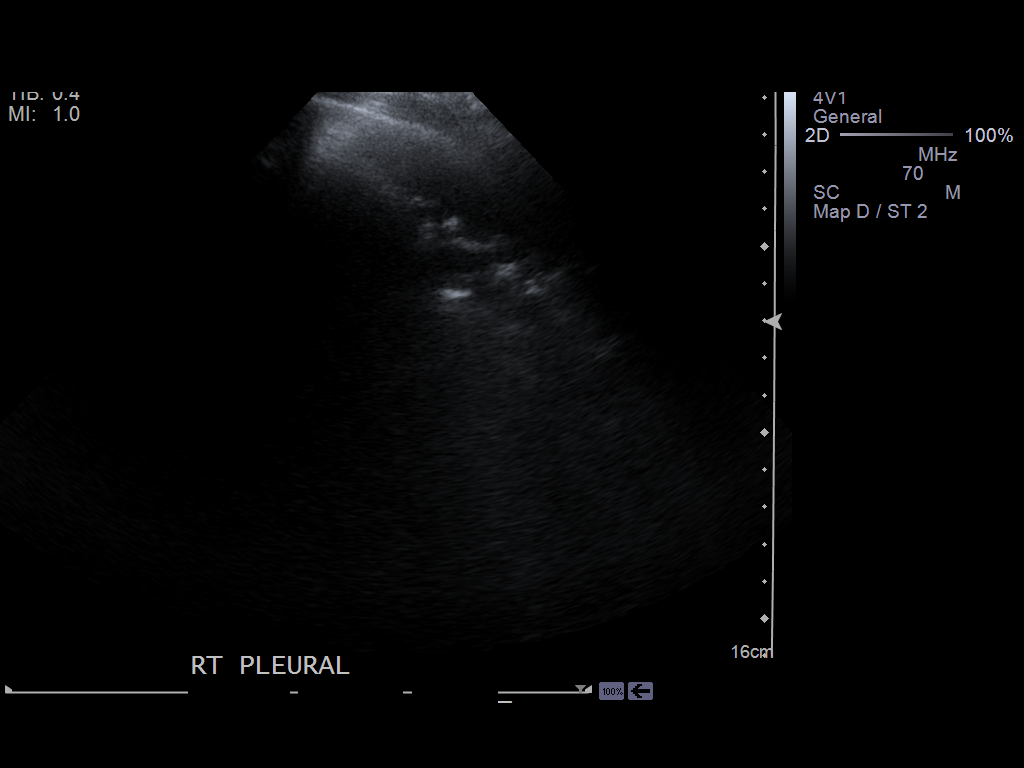
[im 3/3]
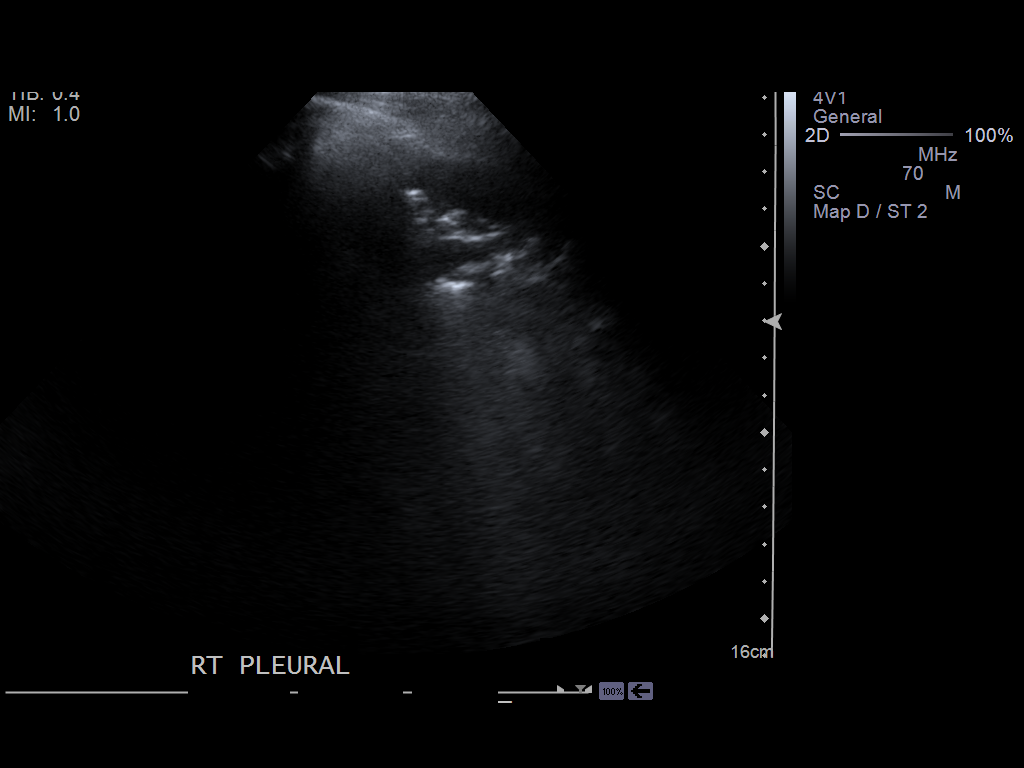

[3 of 3 positions shown; findings below may reference images not displayed]

IMPRESSION: Successful ultrasound directed right thoracentesis.

## 2014-05-10 IMAGING — CR DG CHEST 2V
1 series · 3 of 3 positions shown · non-contrast
Comparison: none

REASON FOR EXAM: respiratory failure
COMMENTS:

[Series 2: x chest ap · 0.14mm/px · 3 of 3 slices shown]
[im 1/3]
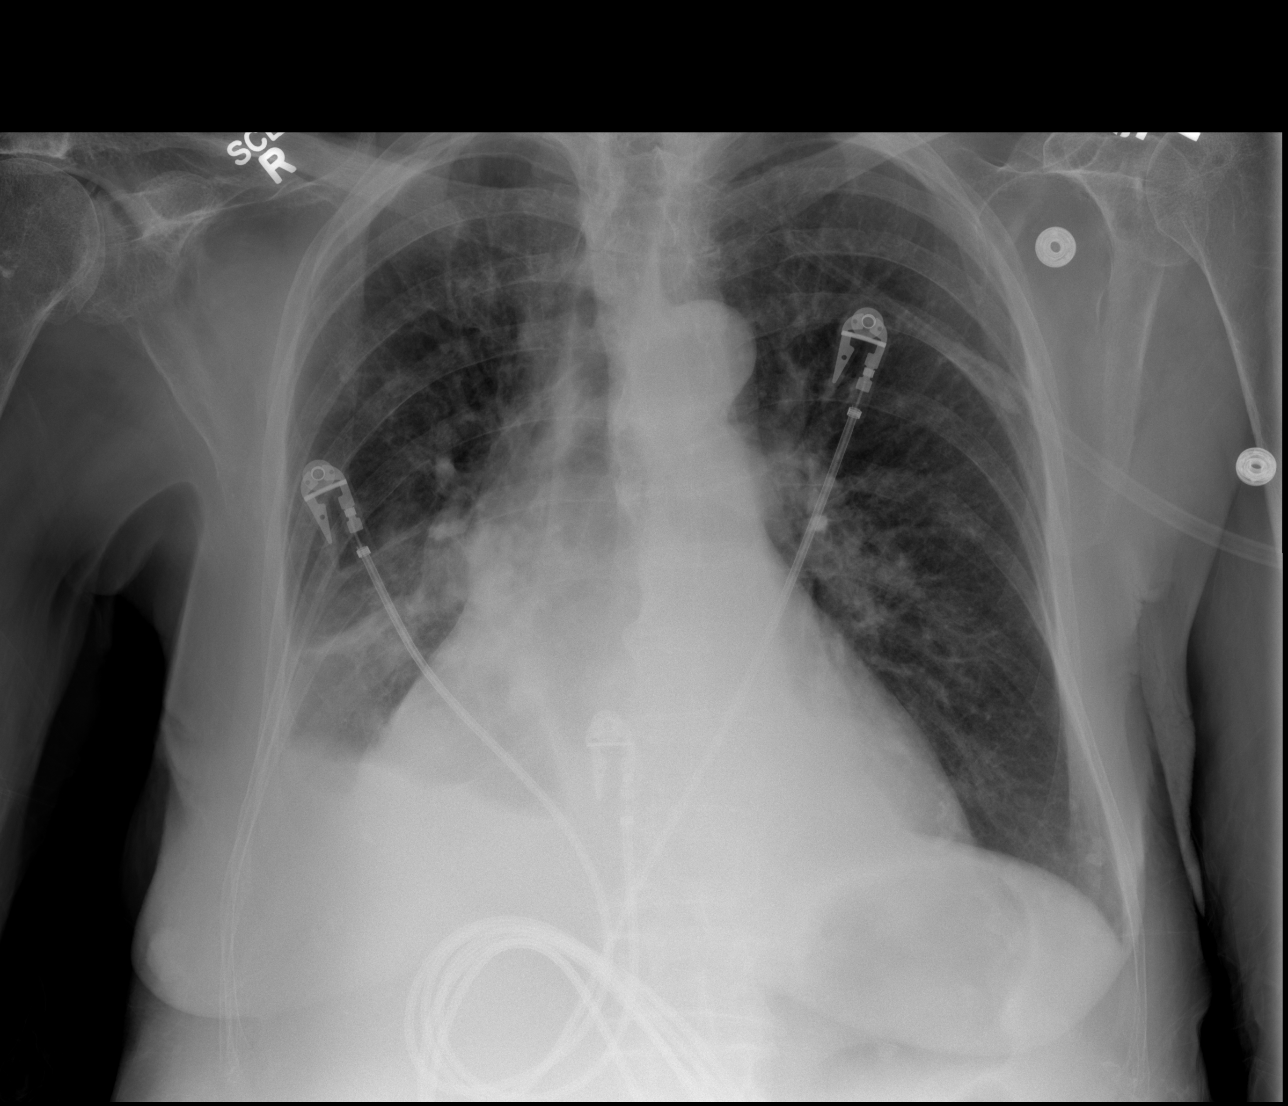
[im 2/3]
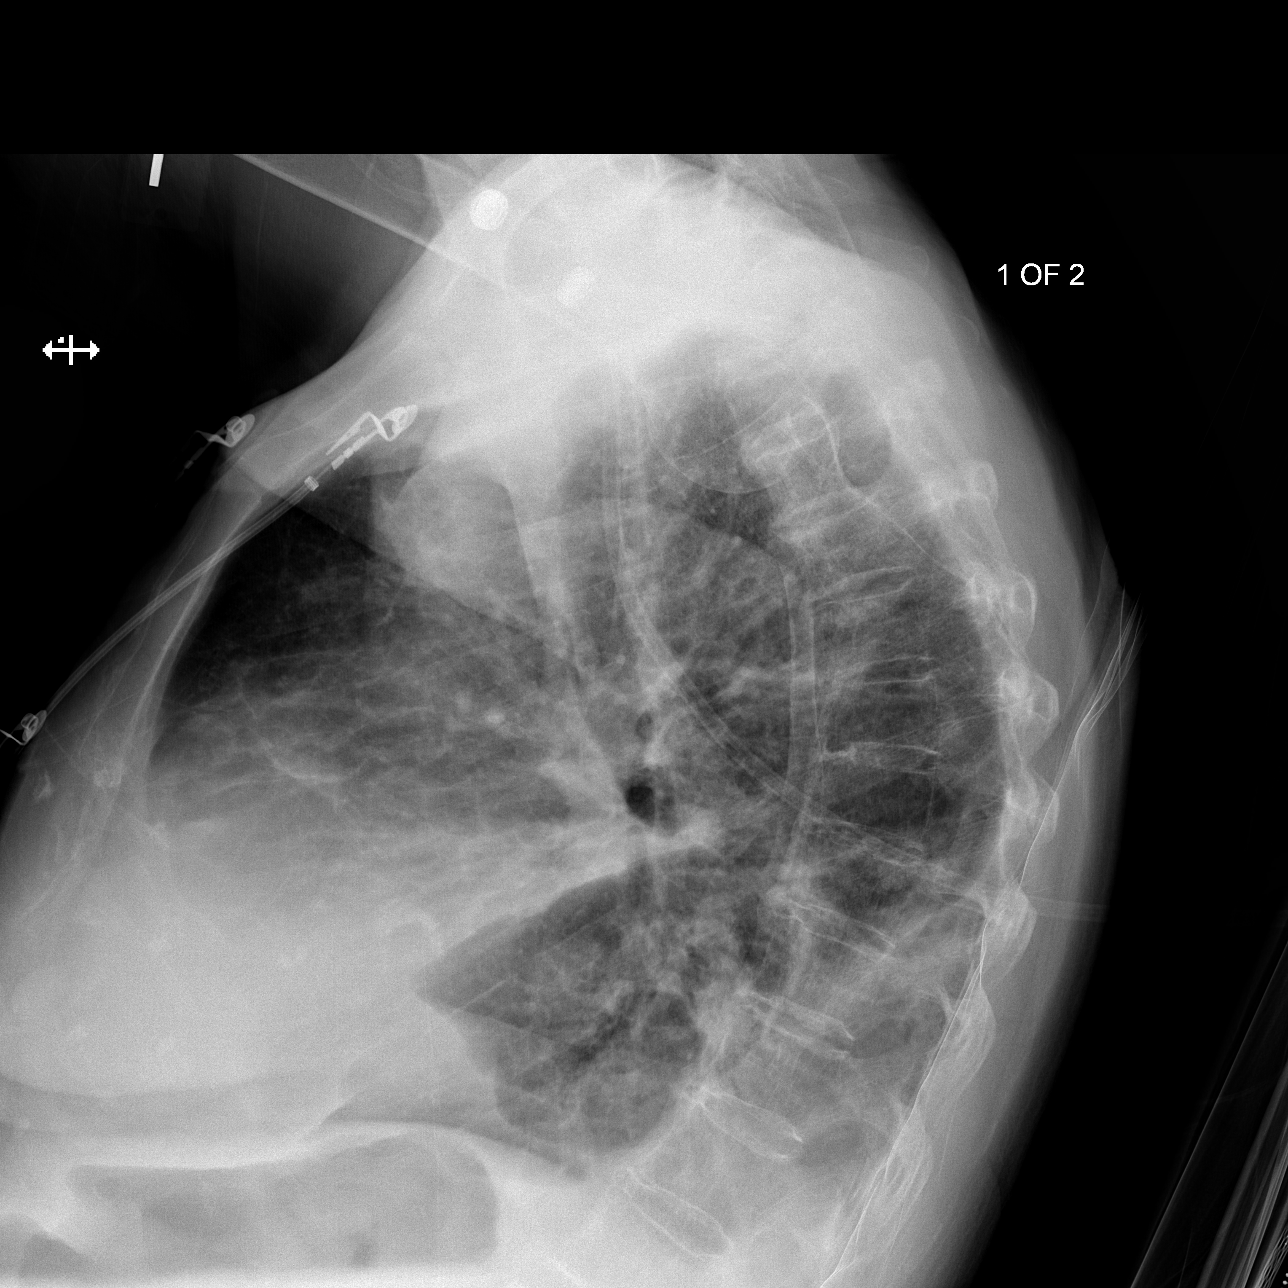
[im 3/3]
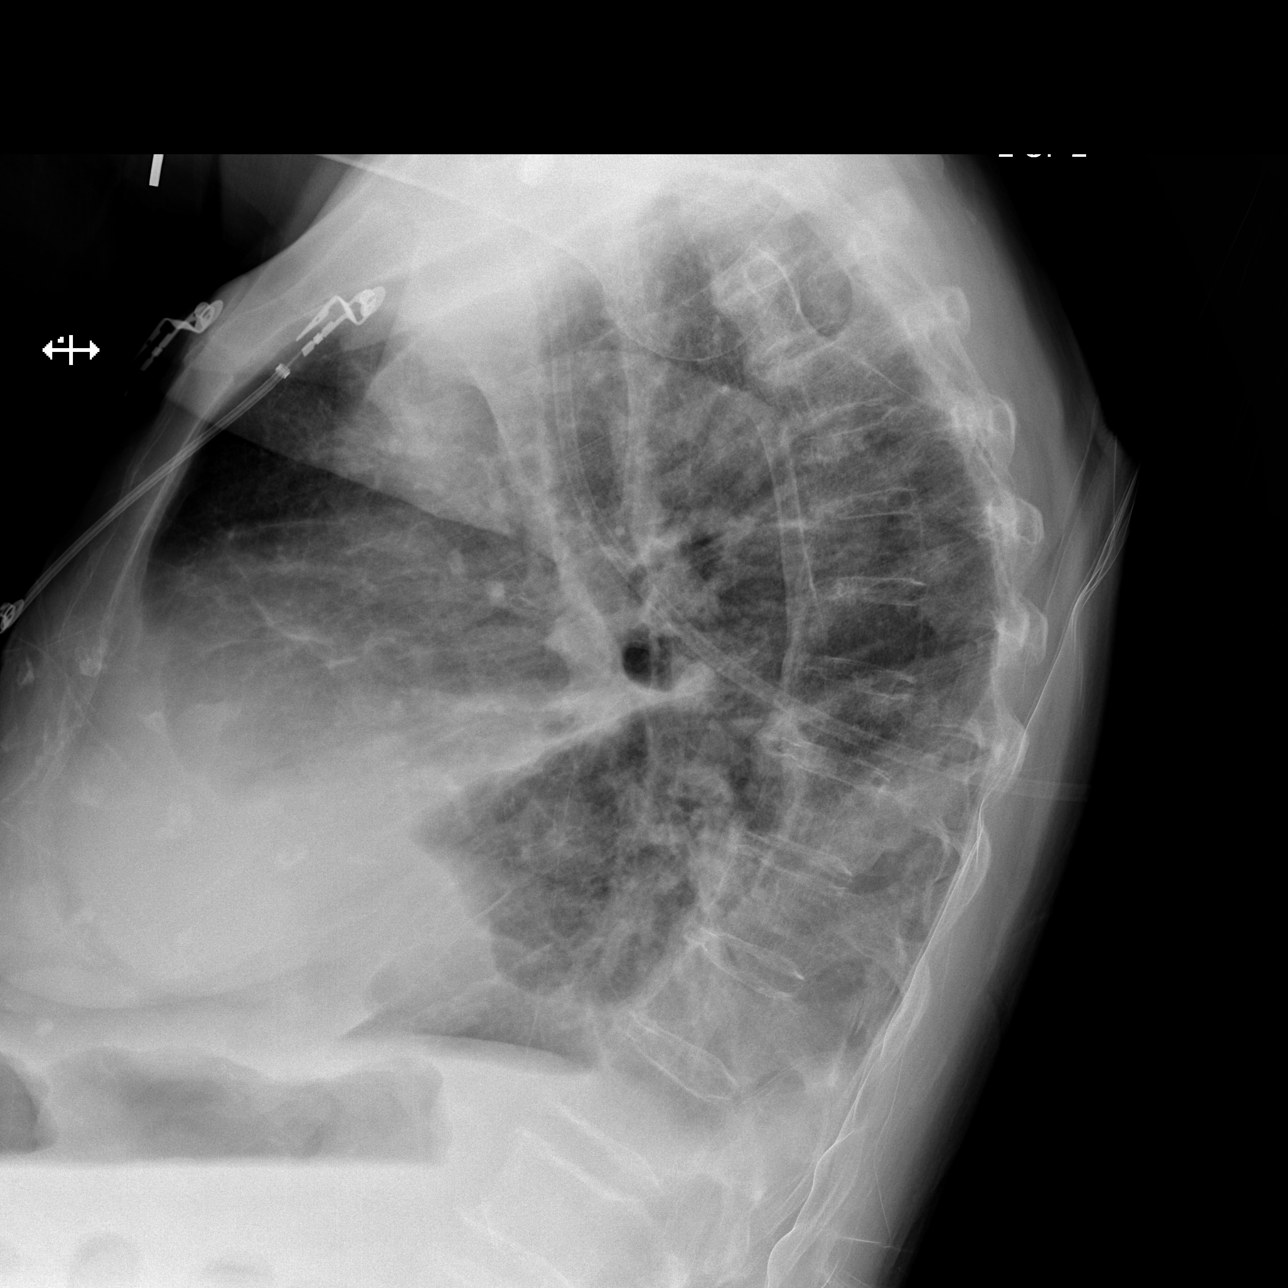

[3 of 3 positions shown; findings below may reference images not displayed]

PROCEDURE:     DXR - DXR CHEST PA (OR AP) AND LATERAL  - June 03, 2013 [DATE]

RESULT:     Comparison is made to the previous study of 05/30/2013. There is
persistent increased density from the right hilum to the right lung base
with elevation of the right hemidiaphragm. There is pulmonary vascular
congestion. There is possible mild interstitial edema or fibrosis. There is
no left pleural effusion but there is some right pleural effusion and some
right middle lobe atelectasis or infiltrate. The bony structures appear
intact.
IMPRESSION: Continued right-sided opacity consistent for underlying
pneumonia. Pleural effusion present. Is pulmonary vascular congestion
possibly with some mild interstitial edema or chronic interstitial fibrosis.

[REDACTED]

## 2014-09-25 IMAGING — US US GUIDE NEEDLE - US [PERSON_NAME]
1 series · 8 of 8 positions shown · non-contrast
Comparison: CT of the chest 10/18/2013.

CLINICAL DATA: Right pleural effusion. Respiratory distress.

EXAM:
ULTRASOUND GUIDED RIGHT THORACENTESIS

[Series 1: us guide needle - us (person_name) · 0.22mm/px · 8 of 8 slices shown]
[im 1/8]
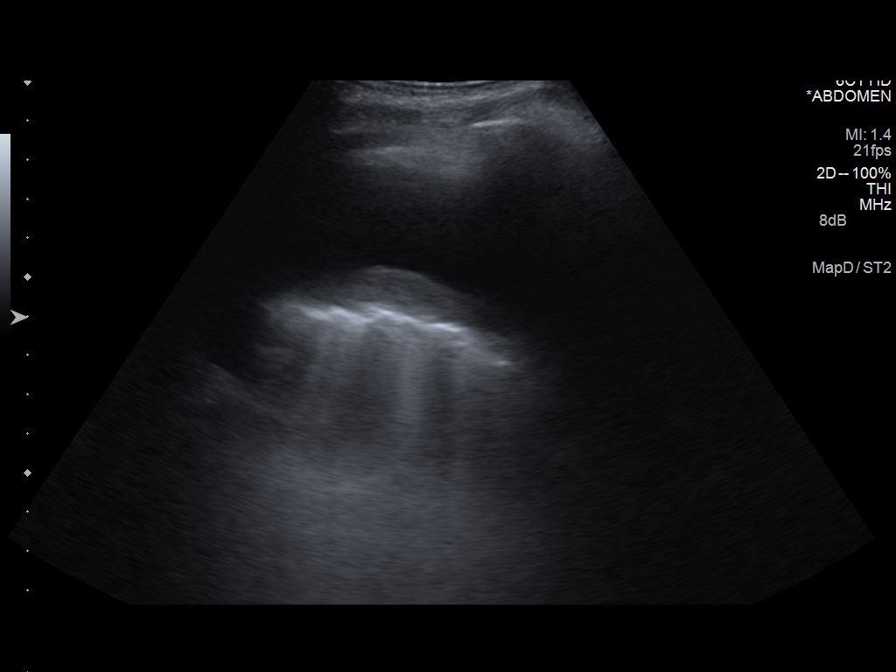
[im 2/8]
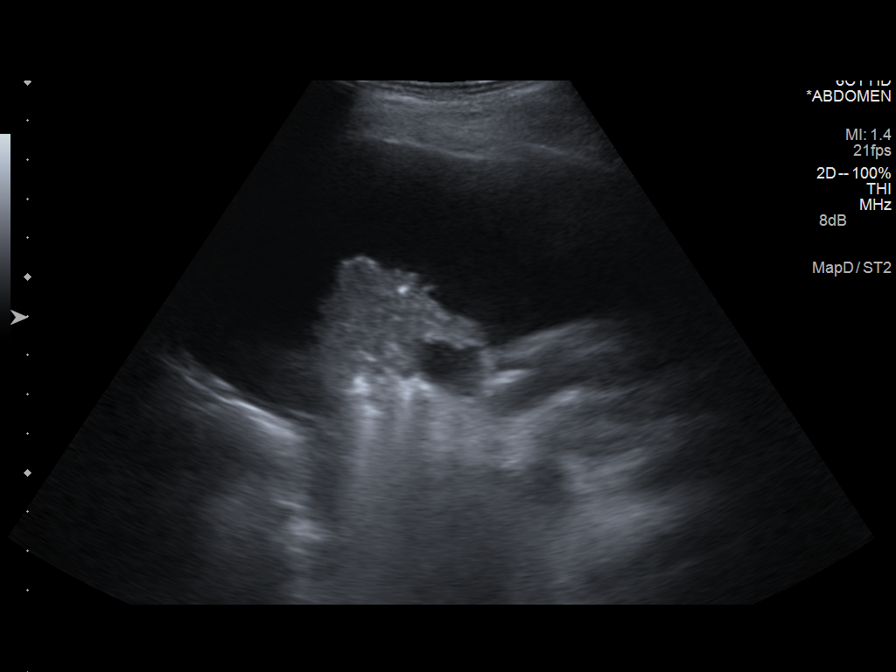
[im 3/8]
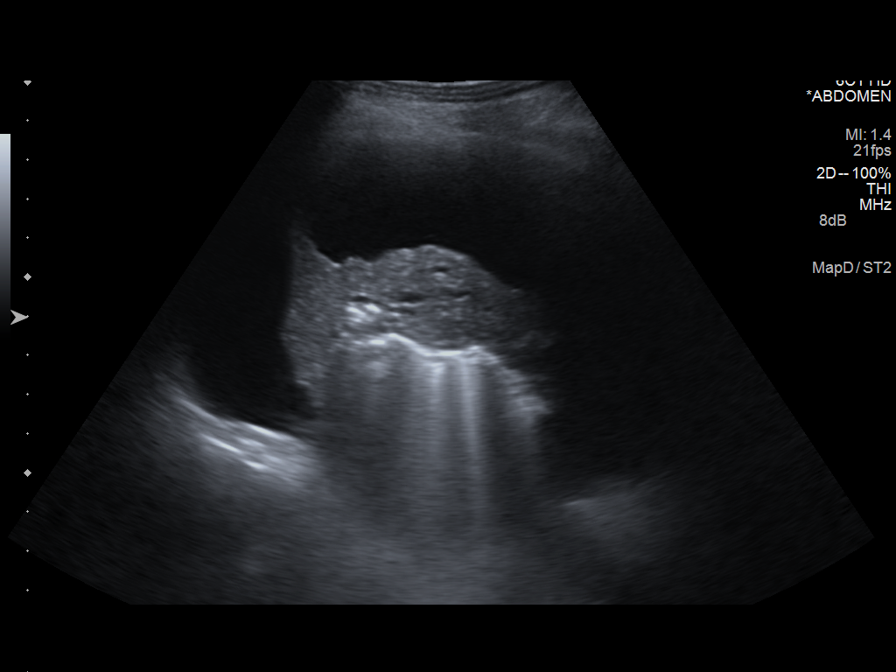
[im 4/8]
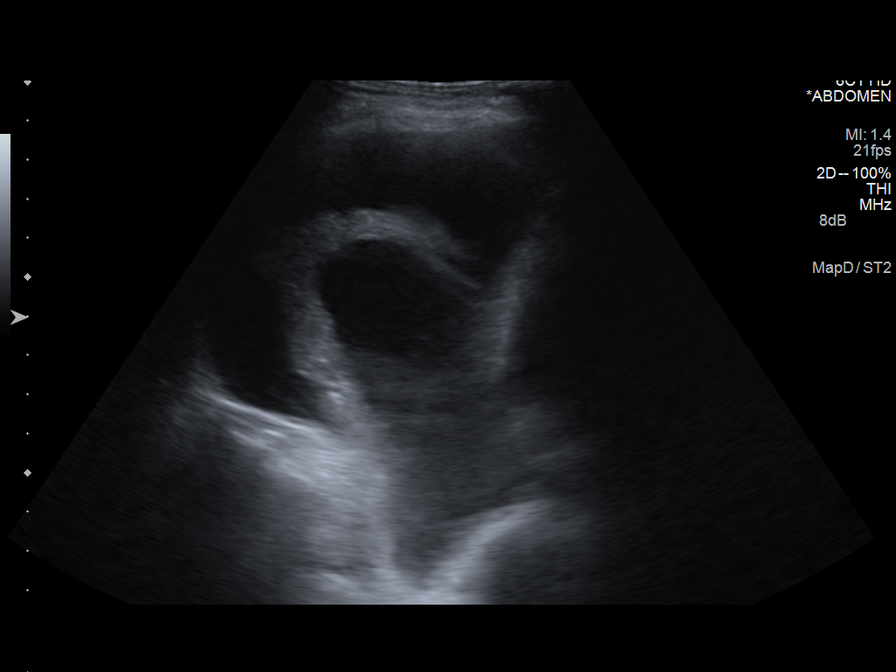
[im 5/8]
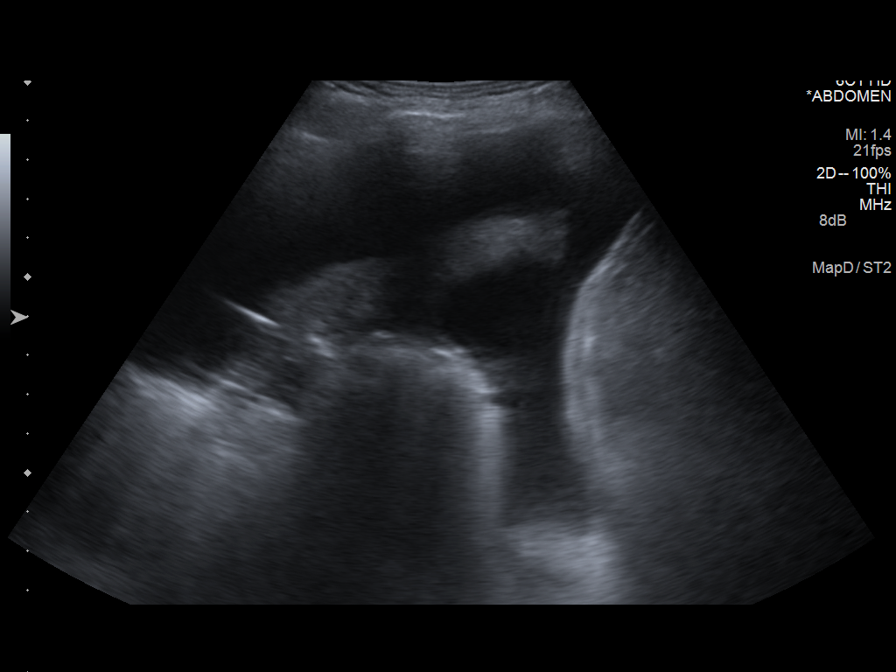
[im 6/8]
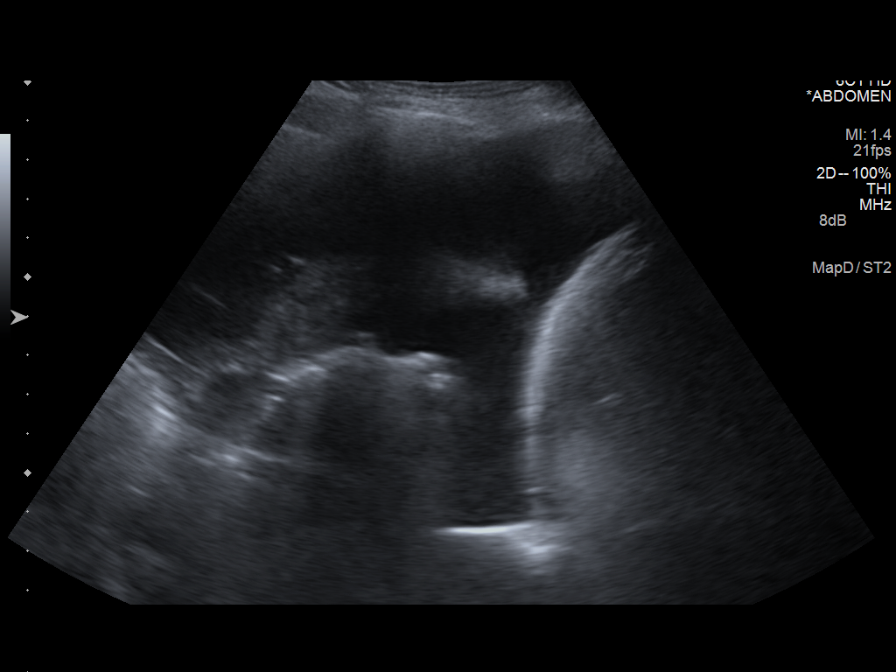
[im 7/8]
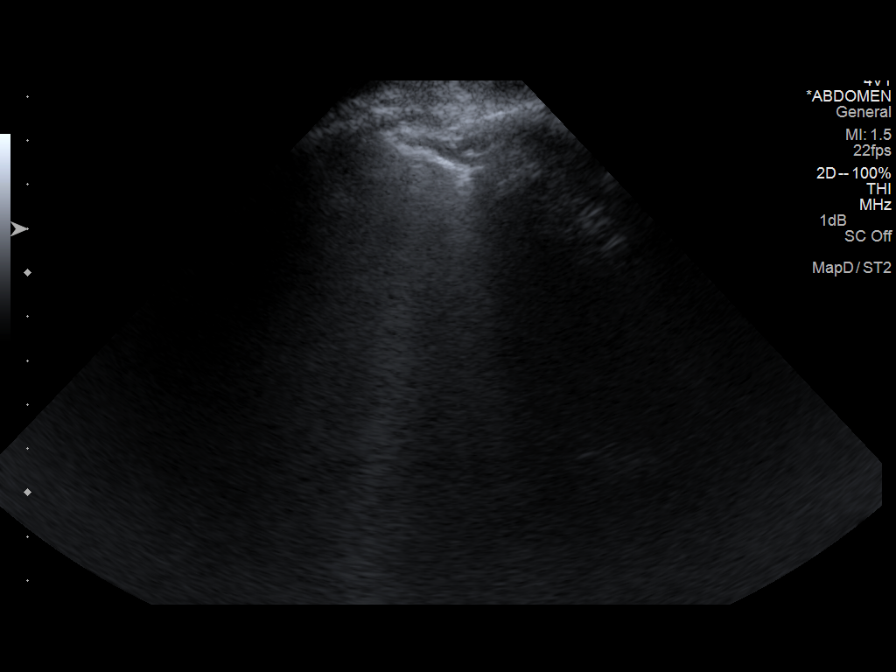
[im 8/8]
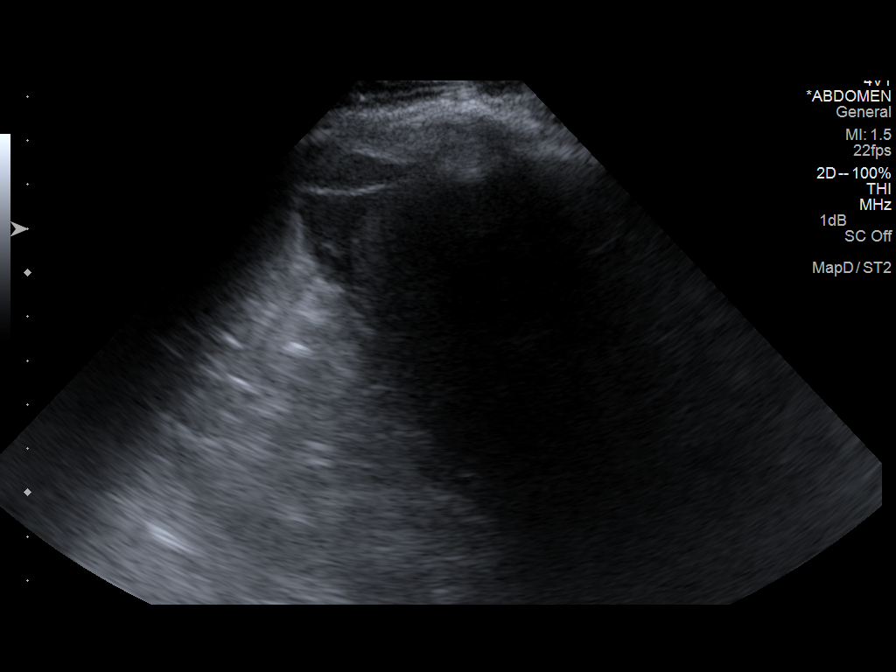

[8 of 8 positions shown; findings below may reference images not displayed]

PROCEDURE:
An ultrasound guided thoracentesis was thoroughly discussed with the
patient and questions answered. The benefits, risks, alternatives
and complications were also discussed. The patient understands and
wishes to proceed with the procedure. Written consent was obtained.

Ultrasound was performed to localize and mark an adequate pocket of
fluid in the right chest. The area was then prepped and draped in
the normal sterile fashion. 1% Lidocaine was used for local
anesthesia. Under ultrasound guidance a 6 French safety centesis
catheter was introduced. Thoracentesis was performed. The catheter
was removed and a dressing applied.

Complications:  None.
FINDINGS: A total of approximately 1.1 L of clear, amber colored fluid was
removed. A fluid sample was sent for laboratory analysis.
IMPRESSION: Successful ultrasound guided right thoracentesis yielding 1.1 L of
pleural fluid.

## 2015-04-06 NOTE — Discharge Summary (Signed)
PATIENT NAME:  Madeline Lopez, Madeline Lopez MR#:  [id number] DATE OF BIRTH:  Dec 04, 1927  DATE OF ADMISSION:  10/17/2013 DATE OF DISCHARGE:  Anticipated discharge on 10/22/2013.   DISCHARGE DIAGNOSES:  1.  Encephalopathy due to hypercapnia.  2.  Atrial fibrillation with rapid ventricular response, on Xarelto.  3.  Chronic obstructive pulmonary disease, stage IV, with acute exacerbation.  4.  Dementia.  5.  Anxiety and depression.   DISCHARGE MEDICATIONS:  1.  Advair 100/50 1 puff b.i.d.  2.  Docusate 100 mg as directed for constipation. 3.  MiraLAX 17 grams daily as directed for constipation.  4.  Bumetanide 1 mg p.o. daily.  5.  Citalopram 20 mg p.o. daily.  6.  Vitamin B12 1000 mcg injections monthly.  7.  Digoxin 125 mcg p.o. daily.  8.  Potassium chloride 20 mEq p.o. b.i.d. while on the bumetanide.  9.  Milk of mag as needed.  10.  Guaifenesin as needed.  11.  Spiriva 18 mcg 1 inhalation daily.  12.  Ventolin 90 mcg 2 puffs every 4 hours as needed for shortness of breath. 13.  Acetaminophen 325 mg p.o. every 4 hours day for pain and fever.  14.  Zoloft to 20 mg p.o. daily.  15.  Diltiazem 240 mg p.o. daily. This is an increase from baseline.  16.  Levofloxacin 500 mg p.o. daily x 7 more days.  17.  Prednisone taper as directed.   CONSULTS:  Pulmonology, Cardiology.   PROCEDURES:  The patient underwent a thoracentesis for a right pleural effusion, did remove 1100 mL.   LPERTINENT LABS AND STUDIES PRIOR TO DISCHARGE:  Sodium 139, potassium 4.2, creatinine 0.72, chloride 95, CO2 44, glucose 140. White blood cell count 11.1, hemoglobin 10.5, and platelets of 299. CT of the chest showed a right pleural effusion on 11/05 with chronic consolidation in the right middle and right lower lungs.   BRIEF HOSPITAL COURSE:  1.  Encephalopathy due to hypercapnia. The patient initially came in unresponsive. ABG showed a CO2 of 120. She was placed on BiPAP. Her CO2 did come down thereafter to 54. She was  back to her baseline neurological state at that point.  2.  Atrial fibrillation with RVR. The patient's blood pressure and rate control medications were held during this process. Therefore her heart rate was quite elevated. She was placed back on her digoxin and diltiazem and her heart rate did improve. She was evaluated by Cardiology, who did recommend elevation of the Cardizem to 240. At that point, her heart rate has been in the 90s and 100s. She has been asymptomatic.  3.  COPD, stage IV, with acute exacerbation. The patient was noted to have retention of CO2, which is baseline for her. Also has consolidation in the right lobe, which appears to be chronic, but may have some underlying acute process involved. She was covered with triple antibiotics and started on steroids. Her symptoms did improve. Her white blood cell count remained stable. She remained afebrile on discharge. She was transitioned to oral Levaquin and prednisone taper. She will continue that as an outpatient.  4.   A pleural effusion. The patient was noted to have an enlarging pleural effusion on the right lower field, evaluated by Pulmonology given the size of the pleural effusion. A thoracentesis was performed by Dr. Thelma Barge and was able to take out 1100 mL. The total protein and LDH was more consistent with transudate. No further workup was done at that point.  5.  Depression and anxiety. She remained stable. Continue with her home citalopram.   DISPOSITION:  She is being held another 24 hours today as we are making final adjustments to her medications. If she is stable, she may be discharged in the morning back [facility]. She was evaluated by PT, who did recommend further rehab. She will need both nursing and physical therapy at the facility. She needs to follow with Dr. Meredeth IdeFleming within seven days. Follow with Dr. Burnadette PopLinthavong within 10 days.  ____________________________ Marisue IvanKanhka Sinaya Minogue, MD kl:jm D: 10/21/2013 10:26:10  ET T: 10/21/2013 10:45:33 ET JOB#: [id number]  cc: Marisue IvanKanhka Ned Kakar, MD, <Dictator> Marisue IvanKANHKA Kinzley Savell MD ELECTRONICALLY SIGNED 11/21/2013 9:55

## 2015-04-06 NOTE — Discharge Summary (Signed)
PATIENT NAME:  Madeline MalesDUCKUM, Tanairy B MR#:  [id number] DATE OF BIRTH:  July 23, 1927  DATE OF ADMISSION:  10/17/2013 DATE OF DISCHARGE:  10/26/2013  Addendum  The patient is actually being discharged to the hospice home today, 10/26/2013.  The patient's discharge yesterday was delayed in preparation for the patient to receive intermittent BiPAP at the hospice home.  Equipment to be ordered and set up for her.  The patient is to get 2 to 3 hours of BiPAP at night, mainly to maintain cognitive function.  Otherwise, she will be on comfort care.   ____________________________ Marisue IvanKanhka Marce Schartz, MD kl:dmm D: 10/26/2013 10:09:14 ET T: 10/26/2013 10:16:25 ET JOB#: [id number]  cc: Marisue IvanKanhka Kyndall Amero, MD, <Dictator> Marisue IvanKANHKA Shakenna Herrero MD ELECTRONICALLY SIGNED 11/21/2013 9:56

## 2015-04-06 NOTE — Consult Note (Signed)
Brief Consult Note: Diagnosis: CHF/SOB/Pleural Effusion/CM.   Patient was seen by consultant.   Consult note dictated.   Recommend to proceed with surgery or procedure.   Recommend further assessment or treatment.   Orders entered.   Discussed with Attending MD.   Comments: IMP S/P thoracentesis CHF CM Hypotension SOB COPD . PLAN Demedex 02 Consider low dose digoxin Low dose B-blocker Coerg for AFIB/CHF ACE low dose for CM/CHF ASA325mg  qd Consider long term anticoug for AFIB Treat for heart failure for now Consider antiaarthymic if AFIB persists Hopefully d/c home soon.  Electronic Signatures: Dorothyann Pengallwood, Darroll Bredeson D (MD)  (Signed 13-Jun-14 22:17)  Authored: Brief Consult Note   Last Updated: 13-Jun-14 22:17 by Dorothyann Pengallwood, Allante Beane D (MD)

## 2015-04-06 NOTE — Op Note (Signed)
PATIENT NAME:  Madeline Lopez, Madeline Lopez MR#:  [id number] DATE OF BIRTH:  [birth year]  DATE OF PROCEDURE:  03/23/2013  PREOPERATIVE DIAGNOSIS:  Mature cataract left eye.   POSTOPERATIVE DIAGNOSIS:  Mature cataract left eye.    PROCEDURE:  Phacoemulsification with posterior chamber intraocular lens placement of the left eye, which required VisionBlue staining of the anterior capsule.   SURGEON:  Deirdre Evenerhadwick R. Shakeyla Giebler, MD    ANESTHESIA:  Retrobulbar block of Xylocaine and bupivacaine with Vitrase administered under intravenous sedation.   PHACO TIME:  24% of five minutes, 45 seconds for CDE of 82.   LENS IMPLANT:  ZCB00 24.0 diopter.   COMPLICATIONS:  None.   DESCRIPTION OF PROCEDURE:  The patient was identified in the holding room and transported to the operative suite and placed in the supine position underneath the operating microscope. The left eye was identified as the operative eye, and a retrobulbar block of Xylocaine and bupivacaine was administered under intravenous sedation. It was then prepped and draped in the usual sterile ophthalmic fashion.   The eye was inspected, and the pupil was well dilated. There was a mature white cataract present. A 1-mm side-port incision was made at the 3 o'clock position. The anterior chamber was filled approximately 75% with Healon-5 viscoelastic. VisionBlue dye was then placed underneath the Healon-5 onto the surface of the anterior capsule to stain it to facilitate capsulorrhexis.   A conjunctival peritomy was made from the 10 o'clock to 2 o'clock position. Hemostasis was achieved with Wet-Field cautery. A crescent blade was used to make a scleral tunnel incision for 4 clock hours 2 mm posterior to the limbus. It was tunneled forward into clear cornea. A 2.4-mm keratome was used to make an incision underneath the scleral tunnel into the anterior chamber. The Healon-5 and VisionBlue dye were washed out using balanced salt solution. Additional Healon-5  viscoelastic was placed into the anterior chamber to deepen it for capsulorrhexis. A cystotome and capsulorrhexis were then used to create a curvilinear capsulorrhexis. Hydrodissection was then performed using balanced salt solution. The cortex was liquefied, and it was hydrodissected out through the incision. The remaining lens was a large dense nuclear lens. Phacoemulsification was then used in a divide and conquer fashion. Generous Viscoat and Healon-5 were used to maintain a deep chamber for the entire phacoemulsification.   After removal of the lens nucleus, there was no epinucleus or cortex to aspirate. The capsular bag was filled with Provisc. A ZCB00 24.0-diopter lens was then injected into the capsular bag. Irrigation/aspiration was used to remove the viscoelastic. There were a few strands of cortex stripped from the capsular bag during this portion. A 10-0 nylon suture was then placed through the center of the 2.4-mm incision. Miostat was placed into the anterior chamber to constrict the pupil, and 0.2 mL of 10 mg/mL of cefuroxime were placed into the anterior chamber for a dosage of 2 mg. After the paracentesis was hydrated, there was still a low physiologic pressure noted. Two additional 10-0 nylon sutures were placed medial to the first suture. After this, the anterior chamber maintained a physiologic pressure without wound leak and without leak on depression of the posterior lip of the incision. The conjunctiva was closed with a combination of cautery and a 10-0 nylon suture. Topical Vigamox drops and Maxitrol ointment were applied to the eye. The eye was shielded.    ____________________________ Deirdre Evenerhadwick R. Enzley Kitchens, MD crb:ms D: 03/23/2013 16:15:37 ET T: 03/23/2013 23:30:43 ET JOB#: [id number]  cc: Dimas Chylehadwick R.  Inez Pilgrim, MD, <Dictator> Lockie Mola MD ELECTRONICALLY SIGNED 03/30/2013 11:28

## 2015-04-06 NOTE — Consult Note (Signed)
PATIENT NAME:  Madeline Lopez, DONAVAN MR#:  [id number] DATE OF BIRTH:  06/23/1927  DATE OF CONSULTATION:  05/30/2013  REFERRING PHYSICIAN:  Marisue Ivan, MD CONSULTING PHYSICIAN:  Rosalyn Gess. Samanyu Tinnell, MD  REASON FOR CONSULTATION: AFB positive sputum.   HISTORY OF PRESENT ILLNESS: The patient is an 81 year old female with a past history significant for CHF, atrial fibrillation and end-stage COPD who was admitted on June 12th after having shortness of breath and hypotension and tachycardia after a thoracentesis. The patient underwent thoracentesis and then subsequently admitted to the hospital. Her work-up has been focusing on her COPD and her congestive heart failure. I am asked to consult regarding a finding on a sputum from 05/16/2013 which was AFB positive. The DNA probe was subsequently done and was negative for blood tuberculosis and AVM. She had a subsequent AFB sputum on June 13th, during this hospitalization, which was negative. The patient states that she has had chronic cough and shortness of breath with occasional episodes of wheezing. She has had some night sweats, but no weight loss. She denies any significant nausea, vomiting or change in her bowels. White count on admission was normal and has remained normal. She is currently receiving Levaquin and is feeling somewhat better. She denies any known contact with tuberculosis although states that her daughter had tested positive by a PPD but was never treated. The daughter has never had active pulmonary TB.  She is currently on levofloxacin IV, but no steroids.   ALLERGIES: KEFLEX.   PAST MEDICAL HISTORY: 1.  End stage COPD. 2.  CHF.  3.  Anemia.  4.  Atrial fibrillation.  5.  Anxiety.   SOCIAL HISTORY: The patient lives with her son. She is a prior smoker having quit 15 years ago. She does not drink.   FAMILY HISTORY: Showed that her parents died in their 65s and 63s.   REVIEW OF SYSTEMS: GENERAL: No fevers, chills. Positive sweats,  mainly at night. No malaise. Positive generalized fatigue.  HEENT: No complaints.  PULMONARY:  Positive cough. Positive shortness of breath. Occasional wheezing. No significant sputum production currently.  CARDIAC: No chest pains or palpitations. No peripheral edema.  GASTROINTESTINAL: No nausea, no vomiting, no abdominal pain and no change in her bowels.  GENITOURINARY: No change in urine.  MUSCULOSKELETAL: No complaints.  SKIN: No rashes.  NEUROLOGIC: No focal weakness.  PSYCHIATRIC: No complaints. All other systems are negative.   PHYSICAL EXAMINATION: VITAL SIGNS: T-max of 98.6, T-current of 98.1, pulse 115, blood pressure 91/62 and 91% on CPAP.  GENERAL: An 81 year old female in no acute distress, somewhat thin.  HEENT: Normocephalic, atraumatic.  LUNGS: Clear to auscultation bilaterally with fair air movement. No focal consolidation.  Able to speak in full sentences. No tachypnea.  HEART:  Irregular, irregular rhythm without murmur, rub or gallop.  ABDOMEN: Soft, nontender and nondistended. No hepatosplenomegaly. No hernia is noted.  EXTREMITIES: No evidence for tenosynovitis. No edema.  SKIN: No rashes.  NEUROLOGIC: The patient was awake and interactive, moving all 4 extremities.  PSYCHIATRIC: Mood and affect appeared normal.   LABORATORY AND DIAGNOSTICS: Shows a BUN of 14 and creatinine 0.67. LFTs are unremarkable. White count is 8.8 with a hemoglobin 12.8, platelet count of 39 and ANC of 7.0. White count on admission was 9.8. Thoracentesis demonstrated 381 nucleated cells, 40% neutrophils, 57% lymphocytes, 3% monocytes. Glucose was 106, protein was 1.3, and LDH was 40. Cultures showed a rare unidentified organism. A urinalysis was unremarkable. AFB smear was negative. AFB from  her initial June 2nd test was positive with DNA PCR negative for MTB and M. avium. Chest x-ray from admission shows small to moderate right pleural effusion, heterogeneous opacities in the right lower lung  secondary to atelectasis versus infection, as well as evidence for interstitial edema. A repeat chest x-ray on June 13th showed findings consistent with congestive heart failure and chest x-ray from June 16th showed congestive heart failure with pulmonary edema and a right pleural effusion. A CT scan without contrast from May 15th showed indeterminate cyclic appearing focus along the posterior border of the right lobe of the thyroid. There is cardiomegaly. There is consolidated versus collapsed lung within the lateral segment of the right middle lobe, indeterminant nodule in the base of the lingula and a small right pleural effusion was also noted.   IMPRESSION: An 81 year old female with a past history significant for end-stage chronic obstructive pulmonary disease, congestive heart failure and atrial fibrillation who was admitted with chronic obstructive pulmonary disease exacerbation, congestive heart failure and acid-fast bacilli positive smear.   RECOMMENDATIONS: 1.  Her mycobacterial tuberculosis PCR was negative. She has some sweats at night, but no weight loss. She has chronic cough, but this is likely related to her chronic obstructive pulmonary disease and congestive heart failure. Her repeat AFB smear was negative.  2.  I agree with stopping isolation.  3.  No need to start anti-tuberculosis medications.  4.  Will await the AFB culture.  5.  Would change to p.o. levofloxacin.  6.  Would continue chronic obstructive pulmonary disease therapy.  7.  Will sign off. Please feel free to call with questions.  This is a low level infectious disease consult. Thank you very much for involving me in Ms. Cudd's care.  ____________________________ Rosalyn GessMichael E. Lynzi Meulemans, MD meb:sb D: 05/30/2013 15:40:07 ET T: 05/30/2013 16:30:38 ET JOB#: [id number]  cc: Rosalyn GessMichael E. Will Heinkel, MD, <Dictator> Lavinia Mcneely E Tonna Palazzi MD ELECTRONICALLY SIGNED 06/06/2013 11:39

## 2015-04-06 NOTE — H&P (Signed)
PATIENT NAME:  Madeline Lopez, Madeline Lopez MR#:  [id number] DATE OF BIRTH:  Apr 19, 1927  DATE OF ADMISSION:  10/17/2013  PRIMARY CARE PHYSICIAN: Marisue Ivan, MD  CHIEF COMPLAINT: Altered mental status.   HISTORY OF PRESENT ILLNESS: This is an 81 year old female patient who was discharged recently on October 3 after she suffered pneumonia. Went to [facility], found unresponsive this morning so brought by the EMS. The patient has end-stage COPD, has 1.5 liters of oxygen. Last ( time she was  alert > was yesterday evening. The patient's family spoke to her. She was at her baseline, able to do conversation, but this morning was unresponsive. Here, the patient's blood gas showed pH of 7.17, CO2 of 120. The patient is a DNR, so she is on BiPAP at 14/5. The patient is still unresponsive, so most of the history is obtained from the charts and also speaking to the family.   PAST MEDICAL HISTORY: Significant for admission recently from September 28 to October 3 for pneumonia and COPD exacerbation. Other medical problems include chronic atrial fibrillation, dementia, depression and diastolic heart failure. Also history of end-stage COPD with chronic oxygen, on 1.5 mL all the time. Other diagnoses include B12 deficiency, anemia.  ALLERGIES ;to  [facility].   SOCIAL HISTORY: The patient lives at [facility]. The patient worked in [employer]. Quit smoking 25 years ago. No alcohol. No drugs.   PAST SURGICAL HISTORY: Significant for cataracts   FAMILY HISTORY: Father had prostate cancer. Mother had CHF.   REVIEW OF SYSTEMS: Unobtainable because she is having altered mental status.   MEDICATIONS: According to recent discharge summary, the patient's medications include:  1.  Xarelto 20 mg p.o. daily. 2.  Prednisone taper that was finished already. 3.  Advair Diskus 100/50, 1puff b.i.d. 4.  Cardizem 180 mg p.o. daily. 5.  Colace 100 mg daily as needed. 6.  MiraLAX 17 grams as needed for  constipation. 7.  Bumex 1 mg daily. 8.  Celexa 20 mg daily. 9.  Vitamin B12 injections once a month. 10.  Digoxin 125 mcg p.o. daily.  11.  KCL 20 mEq p.o. b.i.d. as needed for low potassium when taking the Bumex.  12.  Milk of magnesia for constipation. 13.  Spiriva 18 mcg inhalation daily. 14.  Ventolin 2 puffs every 4 hours as needed.  15.  Augmentin 875/125, 1 tablet p.o. b.i.d. for 9 days. The patient did finish Augmentin. When she was here last time, she was discharged with Augmentin and Zithromax. They were all finished.   PHYSICAL EXAMINATION: VITAL SIGNS: Temperature 99.4, heart rate 122, blood pressure 130/84 but during my visit, the blood pressure was 97/65. She is on 12/6 with 65% FiO2, just being changed to 45% FiO2. Sats are around 90%.  GENERAL: The patient is unresponsive and having BiPAP on.  HEENT: Pupils equally reacting to light. No scleral icterus or conjunctivitis. No pharyngeal erythema. Mucous membranes are dry.  NECK: Supple. No thyroid enlargement. No lymphadenopathy. No JVD. No carotid bruits.  RESPIRATORY: The patient's breath sounds are decreased with bilateral expiratory wheeze. CARDIOVASCULAR: S1, S2 regular, slightly tachycardic.  ABDOMEN: Soft, nontender, nondistended. Bowel sounds present. No hernias.  EXTREMITIES: Does have 1+ edema.  MUSCULOSKELETAL: Not done because she is not oriented, on BiPAP machine.  SKIN: No skin rashes observed.  NEUROLOGIC: The patient is not having any hypertonia. No contractures. Unable to do full assessment because she is unresponsive. PSYCHIATRIC: The patient is unresponsive. Unable to assess any other psych assessment.  LABORATORY, DIAGNOSTIC AND RADIOLOGICAL DATA: Urinalysis is cloudy, amber color, 1+ bacteria. Lactic acid 0.9. Initial pH 7.17, pCO2 of 120, pO2 of 213; this is on BiPAP. The FiO2 has been decreased from 65 to 45 and blood gas is already repeated. WBC 13.5, hemoglobin 11.8, hematocrit 37.1, platelets 358.  Electrolytes: Sodium 140, potassium 4.2, chloride 98, bicarbonate 44, BUN 16, creatinine 0.63, glucose 154, magnesium 2.3. The patient's INR is 1.5. BNP up at 4440.   Chest x-ray: Not significantly changed from before with residual right-sided pneumonia.   EKG shows atrial fibrillation with RVR, 117 beats per minute.   ASSESSMENT AND PLAN: This is an 81 year old female with:  1.  Acute on chronic respiratory failure, likely secondary to chronic obstructive pulmonary disease exacerbation and also residual pneumonia. The patient was here recently, was treated with antibiotics for 2 weeks. Comes in with fever and altered mental status with elevated CO2 on blood gas. The patient's chest x-ray is concerning for still residual pneumonia, so the patient will be going to Intensive Care Unit for close monitoring. Continue the BiPAP. Repeat the blood gas again in another 2 to 3 hours to monitor the CO2. The patient is on IV vancomycin, and the patient will have IV steroids along with BiPAP and albuterol with Atrovent nebulizer. Code status is DNR, so continue BiPAP and see how she responds.  2.  Hyperkalemia: Likely secondary to p.o. potassium supplements. Recheck in the morning.  3.  Acute on chronic diastolic heart failure: She is on Bumex at the nursing home. Here, the blood pressure is slightly low, so the patient will get Bumex once the blood pressure improves.  4.  Atrial fibrillation/chronic atrial fibrillation with rapid ventricular response: The patient is n.p.o. secondary to altered mental status. Can use the Lovenox 1 mg/kg q.12. The rate is almost like 90 to 100, so  hold off on dig and Cardizem. If needed, we can use the Cardizem 5 to 10 mg IV q.4 hour for heart rate more than 120.   CODE STATUS: DO NOT RESUSCITATE.   Discussed the plan with the patient's daughter and brother and will sign out to Dr. Burnadette PopLinthavong.   ____________________________ Katha HammingSnehalatha Lakishia Bourassa, MD sk:jm D: 10/17/2013  13:03:07 ET T: 10/17/2013 14:51:47 ET JOB#: [id number]  cc: Katha HammingSnehalatha Erikson Danzy, MD, <Dictator> Katha HammingSNEHALATHA Rose Hippler MD ELECTRONICALLY SIGNED 11/14/2013 15:17

## 2015-04-06 NOTE — Discharge Summary (Signed)
PATIENT NAME:  Madeline Lopez, Madeline Lopez MR#:  [id number] DATE OF BIRTH:  June 18, 1927  DATE OF ADMISSION:  05/26/2013 DATE OF DISCHARGE:    DISCHARGE DIAGNOSES:  1.  Atrial fibrillation with rapid ventricular response.  2.  Acute on chronic diastolic congestive heart failure with ejection fraction of 55%.  3.  Stage IV chronic obstructive pulmonary disease with chronic hypercapnia.  4.  Pneumonia.  5.  Dementia.  6.  History of B12 deficiency. 7.  Hyperlipidemia.  8.  Anxiety and depression.   DISCHARGE MEDICATIONS:  1.  Spiriva 18 mcg 1 inhalation daily.  2.  Citalopram 20 mg 1-1/2 tabs p.o. daily.  3.  Advair 100/50, 1 puff b.i.d.  4.  Acetaminophen 325, 2 tabs q.4 hours as needed for pain or fever greater than 100.4.  5.  Magnesium hydroxide oral suspension 30 mL p.o. at bedtime as needed for constipation.  6.  Digoxin 0.25 mg 1 tab p.o. daily.  7.  Xarelto 15 mg p.o. daily.  8.  Diltiazem 180 mg extended release 1 capsule p.o. daily.  9.  Bumetanide 1 mg p.o. b.i.d.  10. Docusate 100 mg p.o. daily as needed for constipation.  11. MiraLax 17 grams p.o. daily as needed for constipation.  12. Potassium chloride 20 mEq p.o. b.i.d.  13. Ensure 240 mL p.o. b.i.d. as needed for nutrition supplementation.   CONSULTS:  1.  Cardiology.  2.  Pulmonology.   PROCEDURES: Thoracentesis.  PERTINENT LABS PRIOR TO DISCHARGE: White blood cell count 11.1, hemoglobin 13.1, platelets of 283. Sodium 138, potassium 4.5, creatinine 0.67/ Last blood gas showed a CO2 level of 66. Chest x-ray was consistent with right opacity. Echocardiogram repeat did show ejection fraction of 55 to 60% with moderately enlarged right ventricle, mildly dilated left atrium, mildly dilated right atrium with mild mitral valve regurgitation, mild aortic valve stenosis and moderate-to-severe tricuspid regurgitation with moderately elevated pulmonary artery systolic pressure.   BRIEF HOSPITAL COURSE:  1.  A. fib. with RVR. The patient  initially came in with elevated heart rate and found to be in A. fib. with RVR. She was initially placed on diltiazem and metoprolol, but due to her low blood pressure, she had to discontinue the metoprolol. She was started on digoxin and after consultation with cardiology, the patient was started on anticoagulation therapy with Xarelto 15 mg daily. Prior to discharge, her heart rate has been in the 90s to low 100s and she been asymptomatic. Will continue with the diltiazem as dosed above and also with the Xarelto and digoxin.  2.  Acute on chronic diastolic heart failure. The patient was noted to have pulmonary edema with also moderate pleural effusion. She did undergo a thoracentesis. I was able to take out transudate fluid. She was placed back on Bumex and has diuresed appropriately as tolerated given her blood pressure.  3.  Stage IV COPD with chronic hypercapnia. The patient was noted to have CO2 levels as high as the low 90s, but pH remained within normal limits. She was evaluated by Dr. Meredeth Ide, who did place her on BiPAP. Her CO2 did come down as low as 66. Her mental status improved back to baseline and her breathing was at baseline. She will continue on 2 L of O2 at all times and continue with her home meds.  4.  Other chronic issues remained stable at this time. No changes to those regimens.   DISPOSITION: The patient is going to need further physical therapy and nursing care. She is  being transferred to a skilled nursing facility per [facility]. She will need to follow up with Dr. Burnadette PopLinthavong 1 to 2 days after discharge from the skilled nursing facility.  ____________________________ Marisue IvanKanhka Sharvi Mooneyhan, MD kl:aw D: 06/06/2013 07:42:54 ET T: 06/06/2013 07:53:53 ET JOB#: [id number]  cc: Marisue IvanKanhka Aaminah Forrester, MD, <Dictator> Marisue IvanKANHKA Niva Murren MD ELECTRONICALLY SIGNED 06/20/2013 8:19

## 2015-04-06 NOTE — Consult Note (Signed)
PATIENT NAME:  Madeline Lopez, Madeline Lopez MR#:  [id number] DATE OF BIRTH:  [birth year]  DATE OF CONSULTATION:  10/19/2013  CONSULTING PHYSICIAN:  Reginald Mangels E. Zanetta Dehaan, MD  REASON FOR CONSULTATION: Right pleural effusion.  REQUESTING PHYSICIAN:  Dr. Santiago Glad.  I have personally seen and examined Madeline Lopez.  I have discussed her care with Dr. Belia Heman as well as Dr. Irish Lack in interventional radiology. I discussed her care with the patient's family as well.   HISTORY OF PRESENT ILLNESS:  This is an 81 year old female with a long-standing history of COPD on home oxygen therapy who was brought into the Emergency room,  2 days ago. She was found unresponsive and upon arrival was found to have a blood gas showing a pH of 7.17 and a pCO2 of 120. She was admitted to the Intensive Care Unit where she was treated for her COPD exacerbation. As part of her evaluation she had a chest x-ray and a CT scan done. The CT scan revealed a loculated pleural effusion. The patient has a prior history of a pleural effusion on the right. She had undergone a previous thoracentesis on at least one occasion. The family states that she is a DO NOT RESUSCITATE according to her records. They did not desire any heroic measures when I spoke with them regarding management of her pleural effusion.   PAST MEDICAL HISTORY: Significant for severe COPD with oxygen dependence. She has a history of chronic atrial fibrillation, dementia, depression, and heart failure.   SOCIAL HISTORY: She currently lives at [facility]. She quit smoking approximately 25 years ago. She does not endorse any drugs or alcohol.   FAMILY HISTORY: Her mother had congestive heart failure and her father had prostate cancer.   REVIEW OF SYSTEMS: Unobtainable because of her mental status. However, according to the family, she has been short of breath for a long period of time. She has not had any hemoptysis or fevers.   PHYSICAL EXAMINATION:  GENERAL:  She  is an elderly-appearing woman who is in some distress. Her family was present at the bedside throughout the exam. She was able to converse in sentences although at times her speech was occasionally incoherent. In addition, she did appear to be in some moderate respiratory distress. LUNGS:  Showed markedly diminished breath sounds on the right.  HEART: Her heart was irregularly irregular. Left-sided breath sounds were clear.  ABDOMEN: Her abdomen was soft and nontender. There were no palpable masses. There was no hepatosplenomegaly.  EXTREMITIES: Without clubbing, cyanosis, or edema.   ASSESSMENT AND PLAN: I had a long discussion with the family after reviewing with them the results of the CT scan. There is a loculated right-sided pleural effusion. I reviewed with them in great detail the indications and risks for the various management options for a recurrent pleural effusion. Options including thoracentesis, pigtail catheter drainage with doxycycline installation as well as chest tube placement were all reviewed. I do not think the patient is a candidate for any surgical intervention. After extensive discussion with the daughter who has health care power of attorney, we felt that the best option would be repeat thoracentesis at this time. They did not wish to pursue a pleurodesis options or more aggressive drainage.   I told the family that I would be happy to follow up with her if need be. If there is anything further I can do please do not hesitate to call.   Thank you very much for this consultation.  ____________________________ Sheppard Plumberimothy E. Thelma Bargeaks, MD teo:dp D: 10/19/2013 14:13:01 ET T: 10/19/2013 14:31:46 ET JOB#: [id number]  cc: Marcial Pacasimothy E. Thelma Bargeaks, MD, <Dictator> Jasmine DecemberIMOTHY E Telitha Plath MD ELECTRONICALLY SIGNED 11/14/2013 5:22

## 2015-04-06 NOTE — Discharge Summary (Signed)
PATIENT NAME:  Madeline Lopez, Madeline Lopez MR#:  [id number] DATE OF BIRTH:  [birth year]  DATE OF ADMISSION:  10/17/2013 DATE OF DISCHARGE:  10/25/2013  ADDENDUM:  BRIEF ADDENDUM HOSPITAL COURSE: Patient was not discharged over the weekend due to failure to thrive as far as ability to do rehab. She was found to go back into recurrent hypercapnia, was placed back on BiPAP. Palliative care was consulted for the patient's decline, recommended hospice care. The family has decided to move her to home hospice, where she will receive palliative care, may or may not receive intermittent BiPAP while she is there.   MEDICATIONS: To be managed by the hospice providers.   DISPOSITION: She is in critical condition with poor prognosis. She is a DO NOT RESUSCITATE. I will follow up as an outpatient as needed.    ____________________________ Marisue IvanKanhka Adeana Grilliot, MD kl:cs D: 10/25/2013 16:44:59 ET T: 10/25/2013 19:37:00 ET JOB#: [id number]  cc: Marisue IvanKanhka Samatha Anspach, MD, <Dictator> Marisue IvanKANHKA Valentin Benney MD ELECTRONICALLY SIGNED 11/21/2013 9:55

## 2015-04-06 NOTE — Consult Note (Signed)
General Aspect 81 year old female with history of end-stage COPD admitted with unresponsiveness and respiratory distress after being treated first of October for pneumonia and discharged to [place] care.  Patient does have chronic A. fib and is having RVR.  Patient is now alert conversing with family, seems appropriate.  Her blood pressure is running chronically   around 90 systolic and A. fib continues with RVR in the 130s. She has received 2 diltiazem IV pushes and a Cardizem drip is ordered.  She is also getting by mouth Lanoxin  She does have history Diastolic dysfunction currently does not appear to have any heart failure.  Chest x-ray shows residual pneumonia.  Patient was on anti-coagulation with Xarelto but is switched to Lovenox currently.   Physical Exam:  GEN well developed, no acute distress, critically ill appearing   HEENT pink conjunctivae, hearing intact to voice, moist oral mucosa   RESP postive use of accessory muscles  Wearing O2, Decreased breath sounds   CARD Irregular rate and rhythm  Normal, S1, S2  A. fib with RVR   ABD denies tenderness  soft   EXTR Chronic edema, TED hose present   SKIN skin turgor decreased   NEURO cranial nerves intact, motor/sensory function intact   PSYCH alert, A+O to time, place, person   Review of Systems:  Subjective/Chief Complaint Unconsciousness/ respiratory distress   General: Weakness   Respiratory: Short of breath   Cardiovascular: Palpitations   Review of Systems: All other systems were reviewed and found to be negative   Medications/Allergies Reviewed Medications/Allergies reviewed   Lab Results: Hepatic:  03-Nov-14 10:49   Bilirubin, Total 0.6  Alkaline Phosphatase 121  SGPT (ALT) 47  SGOT (AST)  62  Total Protein, Serum 7.0  Albumin, Serum 3.6  Routine Micro:  03-Nov-14 11:39   Micro Text Report BLOOD CULTURE   COMMENT                   NO GROWTH IN 18-24 HOURS   ANTIBIOTIC                        Micro Text Report BLOOD CULTURE   COMMENT                   NO GROWTH IN 18-24 HOURS   ANTIBIOTIC                       Specimen Source right hand  Specimen Source left ac  Culture Comment NO GROWTH IN 18-24 HOURS  Result(s) reported on 18 Oct 2013 at 11:00AM.  Culture Comment NO GROWTH IN 18-24 HOURS  Result(s) reported on 18 Oct 2013 at 11:00AM.  Lab:  03-Nov-14 11:05   pH (ABG)  7.17  PCO2  120  PO2  213  FiO2 65  O2 Saturation 98.9  O2 Device BIPAP  Specimen Site (ABG) RT RADIAL  Specimen Type (ABG) ARTERIAL  Patient Temp (ABG) 37.0  PSV 12  Mechanical Rate 12  CPAP 6.0    11:06   Lactic Acid, Cardiopulmonary 0.90 (Result(s) reported on 17 Oct 2013 at 11:34AM.)    12:54   pH (ABG)  7.22  PCO2  117  PO2  127  FiO2 45  Base Excess  14.7  HCO3  47.9  O2 Saturation 98.4  O2 Device BIPAP  Specimen Site (ABG) RT RADIAL  Specimen Type (ABG) ARTERIAL  Patient Temp (ABG) 37.0  PSV 12  PEEP 5.0  Mechanical Rate 12    16:40   pH (ABG)  7.33  PCO2  85  PO2 93  FiO2 35  Base Excess  14.7  HCO3  44.8  O2 Saturation 98.0  O2 Device BIPAP  Specimen Site (ABG) RT RADIAL  Specimen Type (ABG) ARTERIAL  Patient Temp (ABG) 37.0  PSV 16  PEEP 6.0  Mechanical Rate 12  Routine Chem:  03-Nov-14 10:49   Glucose, Serum  154  BUN 16  Creatinine (comp) 0.63  Sodium, Serum 140  Potassium, Serum  5.2  Chloride, Serum 98  CO2, Serum  44  Calcium (Total), Serum 9.0  Anion Gap SEE COMMENT  Osmolality (calc) 284  eGFR (African American) >60  eGFR (Non-African American) >60 (eGFR values <29m/min/1.73 m2 may be an indication of chronic kidney disease (CKD). Calculated eGFR is useful in patients with stable renal function. The eGFR calculation will not be reliable in acutely ill patients when serum creatinine is changing rapidly. It is not useful in  patients on dialysis. The eGFR calculation may not be applicable to patients at the low and high extremes of body sizes,  pregnant women, and vegetarians.)  Result Comment TOTAL CO2 - RESULTS VERIFIED BY REPEAT TESTING.  - NOTIFIED OF CRITICAL VALUE  - C/SANDRA WEAVER AT 1138 10/17/13-DAS  - READ-BACK PROCESS PERFORMED. ANION GAP - UNABLE TO CALCULATE ANION GAP DUE TO   - ABNORMALLY HIGH CO2.  Result(s) reported on 17 Oct 2013 at 11:03AM.  Magnesium, Serum 2.3 (1.8-2.4 THERAPEUTIC RANGE: 4-7 mg/dL TOXIC: > 10 mg/dL  -----------------------)  Phosphorus, Serum  5.1 (Result(s) reported on 17 Oct 2013 at 11:20AM.)    11:05   Result Comment - NOTIFIED OF CRITICAL VALUE  - READ-BACK PROCESS PERFORMED.  - dr wJimmye Normanat 1125, 10/17/2013  Result(s) reported on 17 Oct 2013 at 11:34AM.    12:54   Result Comment - NOTIFIED OF CRITICAL VALUE  - READ-BACK PROCESS PERFORMED.  - dr kVianne Bullsat 1300, 10/17/2013  Result(s) reported on 17 Oct 2013 at 01:02PM.    16:40   Result Comment - NOTIFIED OF CRITICAL VALUE  - READ-BACK PROCESS PERFORMED.  - to Dr LRicharda Overlie1700 10/17/13  Result(s) reported on 17 Oct 2013 at 04:57PM.  Routine UA:  03-Nov-14 11:55   Color (UA) Amber  Clarity (UA) Cloudy  Glucose (UA) 50 mg/dL  Bilirubin (UA) Negative  Ketones (UA) Negative  Specific Gravity (UA) 1.019  Blood (UA) Negative  pH (UA) 5.0  Protein (UA) 100 mg/dL  Nitrite (UA) Negative  Leukocyte Esterase (UA) Negative (Result(s) reported on 17 Oct 2013 at 12:17PM.)  RBC (UA) 1 /HPF  WBC (UA) 3 /HPF  Bacteria (UA) 1+  Epithelial Cells (UA) 4 /HPF  Hyaline Cast (UA) 105 /LPF (Result(s) reported on 17 Oct 2013 at 12:17PM.)  Routine Coag:  03-Nov-14 10:49   Prothrombin  18.2  INR 1.5 (INR reference interval applies to patients on anticoagulant therapy. A single INR therapeutic range for coumarins is not optimal for all indications; however, the suggested range for most indications is 2.0 - 3.0. Exceptions to the INR Reference Range may include: Prosthetic heart valves, acute myocardial infarction, prevention of  myocardial infarction, and combinations of aspirin and anticoagulant. The need for a higher or lower target INR must be assessed individually. Reference: The Pharmacology and Management of the Vitamin K  antagonists: the seventh ACCP Conference on Antithrombotic and Thrombolytic Therapy. CNLGXQ.1194Sept:126 (3suppl): [id number] A HCT value >55% may artifactually increase the  PT.  In one study,  the increase was an average of 25%. Reference:  "Effect on Routine and Special Coagulation Testing Values of Citrate Anticoagulant Adjustment in Patients with High HCT Values." American Journal of Clinical Pathology 2006;126:400-405.)  Routine Hem:  03-Nov-14 10:49   WBC (CBC)  13.5  RBC (CBC) 4.04  Hemoglobin (CBC)  11.8  Hematocrit (CBC) 37.1  Platelet Count (CBC) 358  MCV 92  MCH 29.3  MCHC  31.9  RDW  16.3  Neutrophil % 80.8  Lymphocyte % 4.2  Monocyte % 14.6  Eosinophil % 0.0  Basophil % 0.4  Neutrophil #  10.9  Lymphocyte #  0.6  Monocyte #  2.0  Eosinophil # 0.0  Basophil # 0.0 (Result(s) reported on 17 Oct 2013 at 11:03AM.)   Radiology Results: XRay:    03-Nov-14 11:17, Chest Portable Single View  Chest Portable Single View   REASON FOR EXAM:    sepsis  COMMENTS:       PROCEDURE: DXR - DXR PORTABLE CHEST SINGLE VIEW  - Oct 17 2013 11:17AM     RESULT: Frontal view the chest 10/17/2013 comparison to prior dated   09/15/2013.     Findings: Consolidative density projects withinthe right mid to lower   hemithorax. There is prominence of interstitial markings. When compared   to previous study the findings in the right hemithorax have increased.   The cardiac silhouette is partially obscured. Osteophytic changes are   appreciated within the shoulders and acromioclavicular joints.    IMPRESSION:  Increased right hemithorax consolidative density.    Verified By: Mikki Santee, M.D., MD    Keflex: Swelling  Vital Signs/Nurse's Notes: **Vital Signs.:   04-Nov-14 11:00   Vital Signs Type Routine  Temperature Source axillary  Pulse Pulse 140  Respirations Respirations 40  Systolic BP Systolic BP 90  Diastolic BP (mmHg) Diastolic BP (mmHg) 65  Mean BP 73  Pulse Ox % Pulse Ox % 94  Oxygen Delivery 2L; Nasal Cannula  Pulse Ox Heart Rate 19    Impression 81 year old female with end-stage COPD/LOC/respiratory distress/ l pneumonia, diastolic dysfunction currently without any evidence of heart failure but with Chronic A. fib now with RVR secondary to underlying conditions.   Plan 1.  Continue with by mouth digoxin as ordered and Cardizem drip watching closely for significant hyportension. 2.  Continue to treat underlying respiratory issues. 3.  Possibly switch over to by mouth Cardizem by this afternoon since patient now is alert.  Patient was seen in collaboration with Dr. Nehemiah Massed and further medical treatment decisions will be made by him.   Electronic Signatures: Roderic Palau (NP)  (Signed [phone] 11:43)  Authored: General Aspect/Present Illness, History and Physical Exam, Review of System, Labs, Radiology, Allergies, Vital Signs/Nurse's Notes, Impression/Plan   Last Updated: 04-Nov-14 11:43 by Roderic Palau (NP)

## 2015-04-06 NOTE — Discharge Summary (Signed)
PATIENT NAME:  Madeline Lopez, Madeline Lopez MR#:  [id number] DATE OF BIRTH:  [birth year]  DATE OF ADMISSION:  09/11/2013 DATE OF DISCHARGE:  09/16/2013  DISCHARGE DIAGNOSES: 1.  Bilateral pneumonia.  2.  Chronic obstructive pulmonary disease exacerbation.  3.  Atrial fibrillation, on Xarelto.  4.  Dementia.   DISCHARGE MEDICATIONS: 1.  Xarelto 20 mg p.o. daily.  2.  Prednisone taper as directed.  3.  Advair 100/50 one puff Lopez.i.d.  4.  Diltiazem 180 mg p.o. daily.  5.  Docusate sodium 100 mg p.o. daily as needed for constipation.  6.  MiraLax 17 grams daily p.r.n. for constipation.  7.  Bumetanide 1 mg p.o. daily.  8.  Citalopram 20 mg p.o. daily.  9.  Vitamin B12 injection monthly.  10.  Digoxin 125 mcg p.o. daily.  11.  Potassium chloride 20 mEq p.o. Lopez.i.d. as needed for low potassium if taken with the bumetanide. Hold if not taking bumetanide.  12.  Tylenol 325 mg p.o. every 4 hours as needed for pain.  13.  Milk of Magnesia 30 mL p.o. as needed for constipation.  14.  Guaifenesin 10 mL p.o. q.6 hours as needed for cough.  15.  Spiriva 18 mcg 1 capsule p.o. inhaled daily.  16.  Ventolin 2 puffs every 4 hours as needed for dyspnea.  17.  Augmentin 875/125 one tab p.o. Lopez.i.d. x 9 more days.   CONSULTS: None.   PROCEDURES: None.   PERTINENT LABORATORY AND STUDIES: On day of discharge, sodium 137, potassium 4.5, creatinine 0.64, hemoglobin 11.2. White blood cell count 8.4, platelets 231. Chest x-ray prior to discharge shows residual infiltrate in the right lobe with pleural effusion, consistent with pneumonia on the right side. The patient also had a CT of the chest done that showed bilateral pneumonia with COPD and CHF findings.   BRIEF HOSPITAL COURSE:  1.  Acute respiratory failure. The patient initially came in with acute respiratory failure secondary to bilaterally pneumonia with COPD exacerbation and diastolic congestive heart failure features. She was started on triple antibiotics because  she came from a health care facility. She was also started on steroid therapy via Solu-Medrol and was transitioned to prednisone. She was transitioned to oral antibiotics after 72 hours of IV antibiotics. She is being discharged after completion of azithromycin x 5 days and will complete 9 more days of Augmentin.  She has  tolerated the Augmentin without any issues. She will also complete a taper of prednisone. Overall she seems to be back at her baseline status. Will need to diuresis as needed with the bumetanide.  2.  Atrial fibrillation. The patient's rate has been controlled with her home regimen. She is on Xarelto at this time for anticoagulation therapy and tolerating it well. Other chronic medical issues seemed to remain stable.   DISPOSITION: She is in stable condition, being discharged to [facility] care skilled nursing facility for further rehab. She will need PT and nursing care there. She is a DNR/DNI. She will follow up with Dr. Burnadette PopLinthavong as an outpatient if needed and then after discharge in the skilled nursing facility.    ____________________________ Marisue IvanKanhka Zoeie Ritter, MD kl:dp D: 09/16/2013 08:14:00 ET T: 09/16/2013 08:31:57 ET JOB#: [id number]  cc: Marisue IvanKanhka Emelly Wurtz, MD, <Dictator> Marisue IvanKANHKA Captola Teschner MD ELECTRONICALLY SIGNED 09/27/2013 11:39

## 2015-04-06 NOTE — H&P (Signed)
PATIENT NAME:  Madeline Lopez, Madeline Lopez MR#:  [id number] DATE OF BIRTH:  [birth year]  DATE OF ADMISSION:  09/11/2013  PRIMARY CARE PHYSICIAN: Marisue IvanKanhka Linthavong, MD  CARDIOLOGIST: Darlin PriestlyKenneth A. Lady GaryFath, MD  PULMONOLOGIST: Clenton PareHerbon E. 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 She presents with trouble breathing going on for a few days. She was sleeping all day yesterday. They found that her oxygen was off for a period of time. She was very disoriented this a.m. Over the past week, she has had some cough. Family has noted some fluid build-up on her face. The patient was placed on BiPAP in the ER when she had an ABG that showed a pH of 7.37, pCO2 of 79, pO2 of 59, and that was on 28% oxygen. A CT scan of the chest showed bilateral pneumonia, underlying COPD, small right pleural effusion. She also had a fever and was tachycardic. Hospitalist services were contacted for further evaluation.   PAST MEDICAL HISTORY: End-stage COPD, on chronic oxygen 1.5 liters with chronic respiratory failure, chronic diastolic congestive heart failure, atrial fibrillation, anxiety, depression, dementia, hyperlipidemia, B12 deficiency.   PAST SURGICAL HISTORY: Cataracts.   ALLERGIES: QUESTIONABLE TO KEFLEX.   MEDICATIONS: As per Prescription Writer include Advair Diskus 150 one inhalation twice a day, Bumex 1 mg daily, Celexa 20 mg daily, cyanocobalamin 1000 mcg injectable monthly, digoxin 125 mcg daily, diltiazem 180 mg daily, Colace 100 mg daily, guaifenesin 10 mL every 6 hours as needed for cough, Klor-Con 20 mEq twice a day, Mapap 325 mg 1 tablet every 4 hours as needed for pain, milk of magnesia 8% 30 mL once a day as needed for constipation, polyethylene glycol 17 grams daily, Spiriva 1 capsule daily, Ventolin 2 puffs every 4  hours, Xarelto 15 mg daily.   SOCIAL HISTORY: Currently at [facility] since July. Engineer, petroleumCafeteria worker in the past. Quit smoking 25 years ago. No alcohol. No drug use.   FAMILY HISTORY: Father died of prostate cancer. Mother died of congestive heart failure.   REVIEW OF SYSTEMS:    CONSTITUTIONAL: Positive for weight loss. Positive for fever. No chills or sweats. Positive for fatigue.  EYES: She does wear glasses.  EARS, NOSE, MOUTH AND THROAT: No hearing loss. No sore throat. No difficulty swallowing.  CARDIOVASCULAR: No chest pain. No palpitations.  RESPIRATORY: Positive for shortness of breath. Positive for cough. No sputum. No hemoptysis.  GASTROINTESTINAL: No nausea. No vomiting. No abdominal pain. No diarrhea. No constipation.  GENITOURINARY: No burning on urination. No hematuria.  MUSCULOSKELETAL: No joint pain or muscle pain.  INTEGUMENTARY: Positive for itching on the arm.  NEUROLOGIC: No fainting or blackouts.  PSYCHIATRIC: Positive for anxiety, depression.  ENDOCRINE: No thyroid problems.  HEMATOLOGIC AND LYMPHATIC:  No anemia.   PHYSICAL EXAMINATION: VITAL SIGNS: Temperature 99.3, pulse 103, respirations 28, blood pressure 101/74, pulse oximetry 93% on BiPAP. Blood pressure when she presented to the ER includes blood pressure 97/75 on presentation.  GENERAL: Slight respiratory distress on BiPAP.  EYES: Conjunctivae and lids normal. Pupils equal, round and reactive to light. Extraocular muscles intact. No nystagmus.  EARS, NOSE, MOUTH AND THROAT: Tympanic membranes blocked by wax. Nasal mucosa: No erythema. Throat: No erythema, no exudate seen. Lips and gums: No lesions.  NECK: No JVD. No bruits. No lymphadenopathy. No thyromegaly. No thyroid nodules palpated.  RESPIRATORY:  Positive use of accessory muscles to breathe. Positive rhonchi and wheeze throughout entire lung field. Poor air entry.  CARDIOVASCULAR: S1, S2, tachycardic, irregularly irregular. No gallops, rubs or  murmurs heard. Carotid upstroke 2+ bilaterally, no bruits. Dorsalis pedis pulses 2+ bilaterally, 3+ edema bilateral lower extremity.  ABDOMEN: Soft, nontender. No organomegaly/splenomegaly. Normoactive bowel sounds. No masses felt.  LYMPHATIC: No lymph nodes in the neck.  MUSCULOSKELETAL: 3+ edema. No clubbing. No cyanosis on BiPAP.  SKIN: Looks like a small area of psoriasis on the left arm.  NEUROLOGIC: Cranial nerves II through XII grossly intact. Deep tendon reflexes 1+ bilateral lower extremities.  PSYCHIATRIC: The patient is confused, falls asleep easily.   ASSESSMENT AND PLAN: 1.  Acute on chronic hypercarbic respiratory failure: The patient was placed on BiPAP. The patient is a DO NOT RESUSCITATE. Will try to keep the patient out of the Intensive Care Unit. Respiratory therapy to follow closely. Will give Solu-Medrol 40 mg IV q.6 hours, 125 mg IV stat. Continue nebulizer treatments.  2.  Clinical sepsis with pneumonia, fever and tachycardia, borderline hypotension on presentation: Since the patient does reside at a nursing home facility, will give aggressive  antibiotics with Zosyn,  Zithromax and vancomycin. Blood culture x 2.  3.  Acute encephalopathy,  likely secondary to sepsis and elevated CO2.  4.  History of congestive heart failure, diastolic in nature: Will watch fluid status closely. Give IV Lasix while here.  5.  Atrial fibrillation, rapid ventricular response: Continue digoxin and Cardizem. The patient is anticoagulated with Xarelto, will continue.  6.  Anxiety and depression: Continue Celexa.   TIME SPENT ON ADMISSION: 55 minutes. The patient is critically ill. The patient's care will be transferred over to Dr. Burnadette Pop in the a.m.    ____________________________ Herschell Dimes. Renae Gloss, MD rjw:jm D: 09/11/2013 17:35:06 ET T: 09/11/2013 18:32:59 ET JOB#: [id number]  cc: Herschell Dimes. Renae Gloss, MD, <Dictator> Marisue Ivan, MD Salley Scarlet MD ELECTRONICALLY SIGNED  09/17/2013 13:47

## 2015-04-06 NOTE — Consult Note (Signed)
Chief Complaint:  Subjective/Chief Complaint Pt had a bad episode of COPD hypercapnea.Pt still sob but better since thoracentisis. She still has AFIB . Much better today more alert.   VITAL SIGNS/ANCILLARY NOTES: **Vital Signs.:   16-Jun-14 15:29  Vital Signs Type Routine  Temperature Temperature (F) 98.1  Celsius 36.7  Temperature Source oral  Pulse Pulse 115  Respirations Respirations 19  Systolic BP Systolic BP 91  Diastolic BP (mmHg) Diastolic BP (mmHg) 62  Mean BP 71  Pulse Ox % Pulse Ox % 91  Pulse Ox Activity Level  At rest  Oxygen Delivery Non-invasive ventilation (CPAP/BIPAP)  *Intake and Output.:   Shift 16-Jun-14 15:00  Grand Totals Intake:   Output:  500    Net:  -500 24 Hr.:  -500  Urine ml     Out:  500  Length of Stay Totals Intake:  240 Output:  2950    Net:  -2710   Brief Assessment:  GEN well developed, well nourished, no acute distress   Cardiac Irregular  murmur present  + JVD   Respiratory normal resp effort  clear BS  postive use of accessory muscles  rhonchi  crackles   Gastrointestinal Normal   Gastrointestinal details normal Soft  Nontender  Nondistended   EXTR negative cyanosis/clubbing, negative edema   Lab Results: Lab:  16-Jun-14 04:15   pH (ABG) 7.41  PCO2  87  PO2  48  FiO2 25  Base Excess  24.8  HCO3  55.1  O2 Saturation 87  Specimen Site (ABG) RT RADIAL  Specimen Type (ABG) ARTERIAL    14:45   pH (ABG) 7.38  PCO2  93  PO2  78  FiO2 24  Base Excess  24.0  HCO3  55.0  O2 Saturation 96.1  O2 Device [place]  Specimen Site (ABG) RT RADIAL  Specimen Type (ABG) ARTERIAL  Patient Temp (ABG) 37.0 (Result(s) reported on 30 May 2013 at 02:50PM.)  Routine Chem:  16-Jun-14 04:15   Result Comment - CONSISTENT WITH PREVIOUS RESULTS.  - bipap 10/5 rr10 25%  Result(s) reported on 30 May 2013 at 04:44AM.   Radiology Results: XRay:    12-Jun-14 14:50, Chest Portable Single View  Chest Portable Single View   REASON FOR EXAM:     Shortness of Breath  COMMENTS:       PROCEDURE: DXR - DXR PORTABLE CHEST SINGLE VIEW  - May 26 2013  2:50PM     RESULT: Comparison: 12/05/2011    Findings:  Cardiomegaly and the mediastinum are similar to prior given patient   rotation. There is a small to moderate size right pleural effusion. There   are heterogeneous opacities in the right lower lung. There is suggestion   of mild interstitial opacities throughout the lungs.    IMPRESSION:   1. Small to moderate right pleural effusion. Heterogeneous opacities in     the right lower lung may be secondary to atelectasis or infection. Other   underlying parenchymal abnormality is not excluded. Followup is   recommended to ensure resolution.  2. Interstitial opacities suggest interstitial edema.    Dictation site: 2        Verified By: Lewie Chamber, M.D., MD    13-Jun-14 10:22, Chest 1 View for Post Thoracentesis  Chest 1 View for Post Thoracentesis   REASON FOR EXAM:    post thoracentesis  COMMENTS:       PROCEDURE: DXR - DXR CHEST 1 VIEW POST [facility]  - May 27 2013 10:22AM  RESULT: Patient status post thoracentesis. Linear density noted along the   right pleural surface is most likely pleural scarring lung markings   appear to be present, repeat chest x-ray in one hour to be performed to   rule out pneumothorax.    IMPRESSION:  Successful thoracentesis .        Verified By: Gwynn Burly, M.D., MD    13-Jun-14 11:54, Chest Portable Single View  Chest Portable Single View   REASON FOR EXAM:    pneumothorax-s/p thoracentesis  COMMENTS:       PROCEDURE: DXR - DXR PORTABLE CHEST SINGLE VIEW  - May 27 2013 11:54AM     RESULT: The repeat chest x-ray one hour post thoracentesis reveals no   evidence of pneumothorax. Cardia megaly pulmonary venous congestion with   interstitial prominence present. Small right pleural effusion.    IMPRESSION:  Findings consistent with congestive heart failure with   interstitial  edema, this is progressed from prior chest x-ray.   Thoracentesis was successful. No pneumothorax.        Verified By: Gwynn Burly, M.D., MD    16-Jun-14 06:26, Chest Portable Single View  Chest Portable Single View   REASON FOR EXAM:    Rt pleural effusion  COMMENTS:       PROCEDURE: DXR - DXR PORTABLE CHEST SINGLE VIEW  - May 30 2013  6:26AM     RESULT: History: Pleural effusion.    Comparison Study: Prior chest x-ray of 05/27/2013.     Findings: Severe cardiomegaly with pulmonary vascular prominence and   right-sided pleural effusion. Bilateral pulmonary infiltrates. These   findings are consistent with congestive heart failure with pulmonary   edema and right pleural effusion. Similar findings noted on prior study.    IMPRESSION:  Congestive heart failure with pulmonary edema and right     pleural effusion.        Verified By: Gwynn Burly, M.D., MD  [place]:    13-Jun-14 10:23, US Guide Thoracentesis Right  US Guide Thoracentesis Right   REASON FOR EXAM:    right pleural effusion  COMMENTS:       PROCEDURE: [place]  - US GUIDED THORACENTESIS RIGHT  - May 27 2013 10:23AM     RESULT: Ultrasound directed thoracentesis performed without complication   following sterile preparation with local [place] 1% lidocaine. 1200 cc of   clear yellow fluid removed. No complications.    IMPRESSION:  Successful ultrasound directed right thoracentesis.        Verified By: Gwynn Burly, M.D., MD  Cardiology:    12-Jun-14 14:43, ED ECG  Ventricular Rate 110  Atrial Rate 163  QRS Duration 106  QT 340  QTc 460  R Axis -45  T Axis 64  ECG interpretation   Atrial fibrillation with rapid ventricular response with premature ventricular or aberrantly conducted complexes  Incomplete right bundle branch block  Left anterior fascicular block  Abnormal ECG  When compared with ECG of 17-Mar-2013 11:30,  No significant change was found  ----------unconfirmed----------  Confirmed  by OVERREAD, NOT ([id number]), editor PEARSON, BARBARA ([id number]) on 05/27/2013 10:45:13 AM  ED ECG     13-Jun-14 08:01, Echo Doppler  Echo Doppler   REASON FOR EXAM:      COMMENTS:       PROCEDURE: ECH - ECHO DOPPLER COMPLETE(TRANSTHOR)  - May 27 2013  8:01AM     RESULT: Echocardiogram Report    Patient Name:   Madeline Lopez Date  of Exam: 05/27/2013  Medical Rec #:  [id number]                Custom1:  Date of Birth:  12/31/1926             Height:       66.0 in  Patient Age:    81 years              Weight:       162.0 lb  Patient Gender: F                     BSA:          1.83 m??    Indications: CHF  Sonographer:    Cristela BlueJerry Hege RDCS  Referring Phys: Marisue IvanLINTHAVONG, KANHKA    Summary:   1. Left ventricular ejection fraction, by visual estimation, is 30 to   35%.   2. Moderately to severely decreased global left ventricular systolic   function.   3. Moderately reduced RV systolic function.  4. Mildly dilated left atrium.   5. Mildly dilated right atrium.   6. Severe tricuspid regurgitation.   7. Moderate mitral valve regurgitation.   8. Thickening of the anterior and posterior mitral valve leaflets.   9. Mild aortic valve sclerosis without stenosis.  10. Mildly elevated pulmonary artery systolic pressure.  11. Moderately increased left ventricular posterior wall thickness.  2D AND M-MODE MEASUREMENTS (normal ranges within parentheses):  Left Ventricle:          Normal  IVSd (2D):  0.86 cm (0.7-1.1)  LVPWd (2D):     1.40 cm (0.7-1.1) Aorta/LA:                  Normal  LVIDd (2D):     4.87 cm (3.4-5.7) Aortic Root (2D): 3.20 cm (2.4-3.7)  LVIDs (2D):     4.19 cm           Left Atrium (2D): 4.90 cm (1.9-4.0)  LV FS (2D):     14.0 %   (>25%)  LV EF (2D):     29.7 %   (>50%)                                    Right Ventricle:                                    RVd (2D):        4.35 cm  LV DIASTOLIC FUNCTION:  MV Peak E: 1.40 m/s Decel Time: 187 msec  SPECTRAL DOPPLER ANALYSIS(where  applicable):  Mitral Valve:  MV P1/2 Time: 54.23 msec  MV Area, PHT: 4.06 cm??  Aortic Valve: AoV Max Vel: 1.88 m/s AoV Peak PG: 14.1 mmHg AoV Mean PG:   8.7 mmHg  LVOT Vmax: 0.78 m/s LVOT VTI: 0.128 m LVOT Diameter: 2.00 cm  AoV Area, Vmax: 1.30 cm?? AoV Area, VTI: 1.41 cm?? AoV Area, Vmn: 1.36 cm??  Tricuspid Valve and PA/RV Systolic Pressure: TR Max Velocity: 3.03 m/s RA   Pressure: 5 mmHg RVSP/PASP: 41.8 mmHg  Pulmonic Valve:  PV Max Velocity: 0.45 m/s PV Max PG: 0.8 mmHg PV Mean PG:    PHYSICIAN INTERPRETATION:  Left Ventricle: The left ventricular internal cavity size was normal. LV   septal wall thickness was normal. LV posterior wall thickness  was   moderately increased. No left ventricular hypertrophy. Global LV systolic     function was moderately to severely decreased. Left ventricular ejection   fraction, by visual estimation, is 30 to 35%.  Right Ventricle: The right ventricular size is mildly enlarged. Global RV   systolic function is moderately reduced.  Left Atrium: Theleft atrium is mildly dilated.  Right Atrium: The right atrium is mildly dilated.  Pericardium: There is no evidence of pericardial effusion.  Mitral Valve: The mitral valve is normal in structure. There is   thickening of the anterior and posterior mitral valve leaflets. Moderate   mitral valve regurgitation is seen.  Tricuspid Valve: The tricuspid valve is normal. Severe tricuspid   regurgitation is visualized. The tricuspid regurgitant velocity is 3.03   m/s, and with an assumed right atrial pressure of 5 mmHg, the estimated   right ventricular systolic pressure is mildly elevated at 41.8 mmHg.  Aortic Valve: The aortic valve is normal. Mild aortic valve sclerosis is     present, with no evidence of aortic valve stenosis.    [id number] Dorothyann Peng MD  Electronically signed by [id number] Dorothyann Peng MD  Signature Date/Time: 05/27/2013/2:24:25 PM    *** Final ***    IMPRESSION:  .        Verified By: Alwyn Pea, M.D., MD   Assessment/Plan:  Invasive Device Daily Assessment of Necessity:  Does the patient currently have any of the following indwelling devices? none   Assessment/Plan:  Assessment IMP Hypotension AMS -improved AFIB CHF COPD CM Pleural Effusion Weakness Fatigue S/P AFB washings from bronch .   Plan PLAN Agree with Digoxin for rate Consider palliiative care Tele for AFIB 02 for hypoxemia Inhalers for COPD Steroid therapy forCOPD Rate control with diltiazem Low dose Metoprololfor AFIB Consider long term anticoug for AFIB Lasix IV for CHF and effusion Repeat ECHO in few weeks to see if function improved Try low dose ACE/Lasix/B-blocker for CHF/CM F/U CXR For pleural effusion Consider antiarrthymic for AFIB   Electronic Signatures: Dorothyann Peng D (MD)  (Signed 17-Jun-14 22:08)  Authored: Chief Complaint, VITAL SIGNS/ANCILLARY NOTES, Brief Assessment, Lab Results, Radiology Results, Assessment/Plan   Last Updated: 17-Jun-14 22:08 by Dorothyann Peng D (MD)

## 2015-04-06 NOTE — H&P (Signed)
PATIENT NAME:  Madeline Lopez, Madeline Lopez MR#:  [id number] DATE OF BIRTH:  29-Aug-1927  DATE OF ADMISSION:  05/26/2013  ADMITTING PHYSICIAN:  Dr. Gladstone Lighter  PRIMARY CARE PHYSICIAN:  Dr. Netty Starring  PRIMARY CARDIOLOGIST:  Dr. Ubaldo Glassing  PRIMARY PULMONOLOGIST:  Dr. Raul Del  CHIEF COMPLAINT:  Elevated heart rate and hypotensive during thoracentesis procedure.   HISTORY OF PRESENT ILLNESS:  Ms. Cavenaugh is a very pleasant 81 year old Caucasian female with past medical history significant for COPD on 2 liters home oxygen, possible congestive heart failure with valvular disease and diastolic dysfunction with chronic pedal edema, atrial fibrillation not on anticoagulation and anemia who has been having progressive worsening of her breathing over the past 6 weeks, has been seeing Dr. Raul Del. She had an outpatient chest x-ray done followed by a CT chest which confirmed moderate right-sided pleural effusion. At the same time, the CT also showed a possible collapse versus consolidation of the right middle lobe of the lung and patient had a bronchoscopy done last week and she was scheduled to have an outpatient thoracentesis for her right pleural effusion. She is also having worsening of her pedal edema and currently at 4+ edema and seen Dr. Netty Starring 2 days ago and her Lasix was changed over to Bumex b.i.d. dose. She has not noticed an improvement in her breathing or improvement in her pedal edema. She went for her procedure today for her thoracentesis and was noted to be in a-fib with heart rate greater than 100, and also hypotensive with systolic blood pressure in the 90s, so she was sent over to the ER.  At the current time, the patient feels better. No complaints. Her blood pressure is 100/63 and her heart rate is ranging from 100 to 120-range. She does have 4+ pedal edema on exam as well, so she is being admitted for a-fib with RVR and also likely CHF exacerbation.   PAST MEDICAL HISTORY: 1.  COPD and asthma on 2  liters home oxygen.  2.  Anemia. 3.  A-fib. 4.  Valvular heart disease and congestive heart failure. 5.  Anxiety.  PAST SURGICAL HISTORY:  Cataract surgery.  ALLERGIES TO MEDICATIONS:  SHE IS ALLERGIC TO KEFLEX.  HOME MEDICATIONS: 1.  Advair 100/50 one puff b.i.d.  2.  Xanax 0.5 mg once a day in the morning.  3.  Bumex 1 mg p.o. b.i.d.  4.  Cardizem 240 mg p.o. daily.  5.  Celexa 30 mg p.o. daily.  6.  Metoprolol 25 mg p.o. b.i.d.  7.  Spiriva inhaler 1 capsule daily.   SOCIAL HISTORY:  Lives at home with her son, quit smoking more than 15 years ago. No alcohol use.   FAMILY HISTORY:  Both parents died in their late 22s and 33s and no significant family history of heart disease or cancers.  REVIEW OF SYSTEMS:  CONSTITUTIONAL:  No fever, fatigue or weakness.  EYES:  No blurred vision, double vision or glaucoma, uses reading glasses.  ENT:  No tinnitus, ear pain or hearing loss. Positive for some allergies and nasal discharge currently none.  No postnasal drip.  RESPIRATORY: Positive for chronic cough. No wheezing or hemoptysis. Positive for COPD.  CARDIOVASCULAR:  No chest pain or orthopnea. Positive for pedal edema and also dyspnea on exertion. No syncope. Positive for a-fib. GASTROINTESTINAL: No nausea, vomiting, diarrhea, abdominal pain, hematemesis or melena.  GENITOURINARY:  No dysuria, hematuria, renal calculus, frequency or incontinence.  ENDOCRINE:  No polyuria, nocturia, thyroid problems, heat or cold intolerance.  HEMATOLOGY:  No anemia, easy bruising or bleeding.  SKIN:  No acne, rash or lesions.  MUSCULOSKELETAL:  Positive for arthritis. No gout. No neck, back, shoulder pain.  NEUROLOGIC:  No CVA, TIA or seizures. No ataxia.  PSYCHOLOGICAL:  Positive for history of anxiety. No insomnia or depression.   PHYSICAL EXAMINATION: VITAL SIGNS:  Temperature 98.4 degrees Fahrenheit, pulse 118, respirations 26, blood pressure 115/69, pulse ox 97% on 2 liters oxygen.  GENERAL:   Well-built, well-nourished elderly lady sitting in bed, not in any acute distress.  HEENT: Normocephalic, atraumatic. Pupils equal, round, reacting to light. No conjunctival pallor. No scleral icterus. Extraocular movements intact. No nasal lesions. No drainage. Ears:  No discharge or no external lesions. Oropharynx clear without erythema, mass or exudates.  NECK:  Supple. No thyromegaly, JVD or carotid bruits. No lymphadenopathy.  LUNGS:  The patient has crackles at the left lung base and also lower one-third of the right lung base with decreased breath sounds and also diffuse crackles heard. She is using accessory muscles on minimal exertion, like talking, but no wheezing heard. CARDIOVASCULAR: S1, S2 regular rate and rhythm, 3/6 systolic murmur heard in the mitral area. No rubs or gallops.  ABDOMEN:  Soft, nontender, nondistended. No hepatosplenomegaly. Normal bowel sounds.  EXTREMITIES: She does 4+ pedal edema and also varicose veins on her legs, unable to palpate dorsalis pedis pulses due to her edema and she has cold feet as well; but her sensation and range of motion is preserved. No clubbing or cyanosis noted.  MUSCULOSKELETAL:  Range of motion is normal. No tenderness or effusion noted.  SKIN:  Seems well hydrated within normal limits with no rash or ulcers.  LYMPHATICS:  No cervical lymphadenopathy or inguinal lymphadenopathy.  NEUROLOGIC: Cranial nerves are intact. No focal motor or sensory deficits. Deep tendon reflexes are 2+ both legs and also 2+ biceps in both upper arms. PSYCHOLOGIC:  The patient is awake, alert, oriented x 3.   LABORATORY DATA: WBC 9.8, hemoglobin 12.8, hematocrit 38.4, platelet count 314.   Sodium 132, potassium 3.4, chloride 89, bicarb 42, BUN 11, creatinine 0.62, glucose 85 and calcium of 9.1.  ALT 30, AST 31, alk phos 103, total bilirubin 0.8, albumin of 3.4. CK 43, CK-MB 2.4. BNP is 4979 and troponin less than 0.02.  Chest x-ray done today showing small to  moderate right pleural effusion and heterogenous opacities in the right lower lung may be secondary to atelectasis or infection, interstitial opacity suggesting interstitial edema.  Ultrasound showing pleural effusion; however, thoracentesis was not performed due to hypotension and a-fib with RVR.  CT of the chest without contrast done 3 weeks ago showing cardiomegaly, consolidative versus collapsed lung in the lateral segment of right middle lobe, cystic focus in right lobe of thyroid, small right pleural effusion is present and pulmonary edema cannot be ruled out. She had bronchoscopy done on 05/16/2013 and the bronch cultures did not reveal any infection, no yeast and AST stain has been negative so far.  EKG revealing atrial fibrillation, heart rate of 110 beats per minute.  ASSESSMENT AND PLAN:  An 81 year old female with past medical history of atrial fibrillation, chronic obstructive pulmonary disease on 2 liters home oxygen, congestive heart failure and valvular heart disease who is supposed to have an outpatient thoracentesis for right pleural effusion.  Procedure was aborted due to atrial fibrillation with rapid ventricular rate and also hypertension. 1.  Atrial fibrillation with rapid ventricular response:  We will admit to telemetry.  Known history of  atrial fibrillation. Continue on p.o. Cardizem and metoprolol and we will add IV metoprolol p.r.n.  Will get cardiology consultation. She is not on anticoagulation. Not sure if it was stopped because of her procedure.  Will consult with the patient.  2.  Right pleural effusion:  Following with Dr. Raul Del as an outpatient. She had CT chest and bronch done.  No infection was found and rescheduled for thoracentesis, likely pleural effusion  related to her underlying cardiac disease and congestive heart failure.  Will consult Dr. Raul Del while in the hospital and thoracentesis trial again tomorrow if stable.  Avoid heparin and aspirin products for  now.  4.  Chronic obstructive pulmonary disease:  She is on 2 liters oxygen which will continue here, appears stable otherwise. Continue Spiriva and Advair. 5.  Congestive heart failure:  These are secondary to valvular deformities or diastolic dysfunction. Last echo was in 2004 with ejection fraction of 50%. Will repeat her echocardiogram. Cardiology, Dr. Ubaldo Glassing, has been consulted. She was recently changed over to Bumex 2 days ago.  Will now diurese with IV Lasix at this time and continue to monitor her renal function and also her edema at this time. 6.  Gastrointestinal and deep vein thrombosis prophylaxis:  On Protonix and TED stockings.  CODE STATUS:  FULL CODE.  TIME SPENT ON ADMISSION:  50 minutes   ____________________________ Gladstone Lighter, MD rk:ce D: 05/26/2013 17:02:58 ET T: 05/26/2013 17:30:21 ET JOB#: [id number]  cc: Gladstone Lighter, MD, <Dictator> Herbon E. Raul Del, MD Javier Docker Ubaldo Glassing, MD Dion Body, MD  Gladstone Lighter MD ELECTRONICALLY SIGNED 06/07/2013 15:14

## 2015-04-06 NOTE — Consult Note (Signed)
Impression:   81yo female w/ h/o end stage COPD, CHF and afib admitted with COPD exacerbation, CHF and AFB positive smear.    Her MTb PCR was negative.  She has some sweats at night, but no weight loss.  She has chronic cough, but this is likely related to her COPD.  Her repeat AFB smear was negative.   Agree with stopping isolation.   No need to start anti-TB meds.   Await AFB culture.   Will change to po levofloxacin.   Continue COPD therapy.    Will sign off.  Please call with questions.   Electronic Signatures: Maronda Caison MPH, Rosalyn GessMichael E (MD)  (Signed on 16-Jun-14 15:28)  Authored  Last Updated: 16-Jun-14 15:28 by Taydem Cavagnaro MPH, Rosalyn GessMichael E (MD)

## 2015-04-06 NOTE — Consult Note (Signed)
Chief Complaint:  Subjective/Chief Complaint Pt still sob but better since thoracentisis. She still has AFIB.   VITAL SIGNS/ANCILLARY NOTES: **Vital Signs.:   14-Jun-14 07:39  Vital Signs Type Routine  Temperature Temperature (F) 98.4  Celsius 36.8  Temperature Source oral  Pulse Pulse 103  Respirations Respirations 16  Systolic BP Systolic BP 91  Diastolic BP (mmHg) Diastolic BP (mmHg) 54  Mean BP 66  Pulse Ox % Pulse Ox % 94  Pulse Ox Activity Level  At rest  Oxygen Delivery 2L; Nasal Cannula  *Intake and Output.:   14-Jun-14 13:30  Grand Totals Intake:  240 Output:      Net:  240 24 Hr.:  -10  Oral Intake      In:  240  Percentage of Meal Eaten  40   Brief Assessment:  GEN well developed, well nourished, no acute distress   Cardiac Irregular  murmur present  + JVD   Respiratory normal resp effort  clear BS  postive use of accessory muscles  rhonchi  crackles   Gastrointestinal Normal   Gastrointestinal details normal Soft  Nontender  Nondistended   EXTR negative cyanosis/clubbing, negative edema   Lab Results: Routine Chem:  14-Jun-14 04:27   Glucose, Serum  102  BUN 13  Creatinine (comp) 0.77  Sodium, Serum  135  Potassium, Serum 3.5  Chloride, Serum  89  CO2, Serum  > 45  Calcium (Total), Serum  8.4  Anion Gap SEE COMMENT  Osmolality (calc) 270  eGFR (African American) >60  eGFR (Non-African American) >60 (eGFR values <30m/min/1.73 m2 may be an indication of chronic kidney disease (CKD). Calculated eGFR is useful in patients with stable renal function. The eGFR calculation will not be reliable in acutely ill patients when serum creatinine is changing rapidly. It is not useful in  patients on dialysis. The eGFR calculation may not be applicable to patients at the low and high extremes of body sizes, pregnant women, and vegetarians.)  Result Comment CO2 - RESULTS VERIFIED BY REPEAT TESTING.  - C/DEL HOPKINS/0630/05-28-13/[facility]  - NOTIFIED OF CRITICAL  VALUE  - READ-BACK PROCESS PERFORMED.  Result(s) reported on 28 May 2013 at [facility]   Radiology Results: XRay:    12-Jun-14 14:50, Chest Portable Single View  Chest Portable Single View   REASON FOR EXAM:    Shortness of Breath  COMMENTS:       PROCEDURE: DXR - DXR PORTABLE CHEST SINGLE VIEW  - May 26 2013  2:50PM     RESULT: Comparison: 12/05/2011    Findings:  Cardiomegaly and the mediastinum are similar to prior given patient   rotation. There is a small to moderate size right pleural effusion. There   are heterogeneous opacities in the right lower lung. There is suggestion   of mild interstitial opacities throughout the lungs.    IMPRESSION:   1. Small to moderate right pleural effusion. Heterogeneous opacities in     the right lower lung may be secondary to atelectasis or infection. Other   underlying parenchymal abnormality is not excluded. Followup is   recommended to ensure resolution.  2. Interstitial opacities suggest interstitial edema.    Dictation site: 2        Verified By: RGregor Hams M.D., MD    13-Jun-14 10:22, Chest 1 View for Post Thoracentesis  Chest 1 View for Post Thoracentesis   REASON FOR EXAM:    post thoracentesis  COMMENTS:       PROCEDURE: DXR - DXR CHEST  [place]  - May 27 2013 10:22AM     RESULT: Patient status post thoracentesis. Linear density noted along the   right pleural surface is most likely pleural scarring lung markings   appear to be present, repeat chest x-ray in one hour to be performed to   rule out pneumothorax.    IMPRESSION:  Successful thoracentesis .        Verified By: Osa Craver, M.D., MD  [place]:    13-Jun-14 10:23, US Guide Thoracentesis Right  US Guide Thoracentesis Right   REASON FOR EXAM:    right pleural effusion  COMMENTS:       PROCEDURE: [place]  - US GUIDED THORACENTESIS RIGHT  - May 27 2013 10:23AM     RESULT: Ultrasound directed thoracentesis performed without complication    following sterile preparation with local [place] 1% lidocaine. 1200 cc of   clear yellow fluid removed. No complications.    IMPRESSION:  Successful ultrasound directed right thoracentesis.        Verified By: Osa Craver, M.D., MD  Cardiology:    12-Jun-14 14:43, ED ECG  Ventricular Rate 110  Atrial Rate 163  QRS Duration 106  QT 340  QTc 460  R Axis -44  T Axis 64  ECG interpretation   Atrial fibrillation with rapid ventricular response with premature ventricular or aberrantly conducted complexes  Incomplete right bundle branch block  Left anterior fascicular block  Abnormal ECG  When compared with ECG of 17-Mar-2013 11:30,  No significant change was found  ----------unconfirmed----------  Confirmed by OVERREAD, NOT ([id number]), editor PEARSON, BARBARA ([id number]) on 05/27/2013 10:45:13 AM  ED ECG     13-Jun-14 08:01, Echo Doppler  Echo Doppler   REASON FOR EXAM:      COMMENTS:       PROCEDURE: [place] - ECHO DOPPLER COMPLETE(TRANSTHOR)  - May 27 2013  8:01AM     RESULT: Echocardiogram Report    Patient Name:   Madeline Lopez Date of Exam: 05/27/2013  Medical Rec #:  [id number]                Custom1:  Date of Birth:  [birth year]             Height:       66.0 in  Patient Age:    81 years              Weight:       162.0 lb  Patient Gender: F                     BSA:          1.83 m??    Indications: CHF  Sonographer:    Sherrie Sport RDCS  Referring Phys: Dion Body    Summary:   1. Left ventricular ejection fraction, by visual estimation, is 30 to   35%.   2. Moderately to severely decreased global left ventricular systolic   function.   3. Moderately reduced RV systolic function.  4. Mildly dilated left atrium.   5. Mildly dilated right atrium.   6. Severe tricuspid regurgitation.   7. Moderate mitral valve regurgitation.   8. Thickening of the anterior and posterior mitral valve leaflets.   9. Mild aortic valve sclerosis without stenosis.  10. Mildly elevated  pulmonary artery systolic pressure.  11. Moderately increased left ventricular posterior wall thickness.  2D AND M-MODE MEASUREMENTS (normal ranges within parentheses):  Left Ventricle:  Normal  IVSd (2D):  0.86 cm (0.7-1.1)  LVPWd (2D):     1.40 cm (0.7-1.1) Aorta/LA:                  Normal  LVIDd (2D):     4.87 cm (3.4-5.7) Aortic Root (2D): 3.20 cm (2.4-3.7)  LVIDs (2D):     4.19 cm           Left Atrium (2D): 4.90 cm (1.9-4.0)  LV FS (2D):     14.0 %   (>25%)  LV EF (2D):     29.7 %   (>50%)                                    Right Ventricle:                                    RVd (2D):        4.09 cm  LV DIASTOLIC FUNCTION:  MV Peak E: 1.40 m/s Decel Time: 187 msec  SPECTRAL DOPPLER ANALYSIS(where applicable):  Mitral Valve:  MV P1/2 Time: 54.23 msec  MV Area, PHT: 4.06 cm??  Aortic Valve: AoV Max Vel: 1.88 m/s AoV Peak PG: 14.1 mmHg AoV Mean PG:   8.7 mmHg  LVOT Vmax: 0.78 m/s LVOT VTI: 0.128 m LVOT Diameter: 2.00 cm  AoV Area, Vmax: 1.30 cm?? AoV Area, VTI: 1.41 cm?? AoV Area, Vmn: 1.36 cm??  Tricuspid Valve and PA/RV Systolic Pressure: TR Max Velocity: 3.03 m/s RA   Pressure: 5 mmHg RVSP/PASP: 41.8 mmHg  Pulmonic Valve:  PV Max Velocity: 0.45 m/s PV Max PG: 0.8 mmHg PV Mean PG:    PHYSICIAN INTERPRETATION:  Left Ventricle: The left ventricular internal cavity size was normal. LV   septal wall thickness was normal. LV posterior wall thickness was   moderately increased. No left ventricular hypertrophy. Global LV systolic     function was moderately to severely decreased. Left ventricular ejection   fraction, by visual estimation, is 30 to 35%.  Right Ventricle: The right ventricular size is mildly enlarged. Global RV   systolic function is moderately reduced.  Left Atrium: Theleft atrium is mildly dilated.  Right Atrium: The right atrium is mildly dilated.  Pericardium: There is no evidence of pericardial effusion.  Mitral Valve: The mitral valve is normal in  structure. There is   thickening of the anterior and posterior mitral valve leaflets. Moderate   mitral valve regurgitation is seen.  Tricuspid Valve: The tricuspid valve is normal. Severe tricuspid   regurgitation is visualized. The tricuspid regurgitant velocity is 3.03   m/s, and with an assumed right atrial pressure of 5 mmHg, the estimated   right ventricular systolic pressure is mildly elevated at 41.8 mmHg.  Aortic Valve: The aortic valve is normal. Mild aortic valve sclerosis is     present, with no evidence of aortic valve stenosis.    [place] MD  Electronically signed by [place] Lujean Amel MD  Signature Date/Time: 05/27/2013/2:24:25 PM    *** Final ***    IMPRESSION: .        Verified By: Yolonda Kida, M.D., MD   Assessment/Plan:  Invasive Device Daily Assessment of Necessity:  Does the patient currently have any of the following indwelling devices? none   Assessment/Plan:  Assessment IMP AFIB CHF COPD CM Pleural  Effusion Weakness Fatigue .   Plan PLAN Tele for AFIB 02 for hypoxemia Inhalers for COPD Steroid therapy forCOPD Rate control with diltiazem Low dose Metoprololfor AFIB Consider long term anticoug for AFIB Lasix IV for CHF and effusion Repeat ECHO in few weeks to see if function improved Try low dose ACE/Lasix/B-blocker for CHF/CM F/U CXR For pleural effusion   Electronic Signatures: Lujean Amel D (MD)  (Signed 17-Jun-14 21:44)  Authored: Chief Complaint, VITAL SIGNS/ANCILLARY NOTES, Brief Assessment, Lab Results, Radiology Results, Assessment/Plan   Last Updated: 17-Jun-14 21:44 by Lujean Amel D (MD)

## 2015-04-08 NOTE — Discharge Summary (Signed)
PATIENT NAME:  Madeline Lopez, Madeline Lopez  DIAGNOSES AT TIME OF DISCHARGE: 1. Rapid irregular heartbeat, most likely secondary to atrial fibrillation with rapid ventricular response  2. Chronic obstructive pulmonary disease.  3. Anxiety.   CHIEF COMPLAINT: Rapid heart rhythm associated with weakness.   HISTORY OF PRESENT ILLNESS: Madeline Lopez is an 81 year old female who [facility] where she presented for a B12 shot, was noted to have an elevated heart rate of 200. She was subsequently sent to the ER, was noted in rapid supraventricular tachycardia and spontaneously converted to a slower rhythm with heart rate of around 140. She was given IV Cardizem and [facility].   PAST MEDICAL HISTORY:  1. Chronic obstructive pulmonary disease. 2. Vitamin B12 deficiency. 3. Dementia. 4. Anxiety, depression. 5. Peripheral edema.   PAST SURGICAL HISTORY:  1. Right cataract surgery.  2. Cervical biopsy.   PHYSICAL EXAMINATION: GENERAL: She was anxious, not in distress. VITAL SIGNS: Afebrile, blood pressure 117/71, pulse 129, oxygen saturation 92% on 2 liters nasal cannula.  HEENT: Normocephalic, atraumatic. HEART: S1, S2, tachycardic. LUNGS: Clear to auscultation bilaterally. ABDOMEN: Soft, nontender. EXTREMITIES: 1+ edema. NEUROLOGIC: Nonfocal.   HOSPITAL COURSE: Patient was ruled out for myocardial infarction. She was also placed on IV Cardizem and subsequently switched to p.o. Cardizem. A cardiology consult was obtained with Dr. Lady GaryFath, who felt that she can be switched to Cardizem CD and advised additional digoxin p.o. During her stay in the hospital she was comfortable, not in distress. She was ambulated and was allowed to be discharged in stable condition on the following medications.   DISCHARGE MEDICATIONS:  1. Digoxin 0.125 mg a day.  2. Cardizem CD  180 mg a day.  3. Spiriva 18 mcg 1 inhalation daily.  4. Lasix 20 mg a day.  5. Advair Diskus 100/50, 1 puff once a day.  6. Celexa 30 mg p.o. daily.   FOLLOW UP: She has been advised to follow up with Dr. Burnadette PopLinthavong and also with Dr. Lady GaryFath, cardiologist, in 1 to 2 weeks' time. Advised to call us if there are any questions or concerns.   ____________________________ Barbette ReichmannVishwanath Karthika Glasper, MD vh:cms D: Lopez 13:34:12 ET T: 12/08/2011 08:52:39 ET JOB#: [id number]  cc: Barbette ReichmannVishwanath Romeo Zielinski, MD, <Dictator> Barbette ReichmannVISHWANATH Seda Kronberg MD ELECTRONICALLY SIGNED 12/23/2011 17:37
